# Patient Record
Sex: Male | Born: 1960
Health system: Southern US, Community
[De-identification: ages and names within clinical notes are randomized; demographics above are authoritative.]

## PROBLEM LIST (undated history)

## (undated) DIAGNOSIS — G43909 Migraine, unspecified, not intractable, without status migrainosus: Secondary | ICD-10-CM

## (undated) DIAGNOSIS — Z87442 Personal history of urinary calculi: Secondary | ICD-10-CM

## (undated) DIAGNOSIS — K219 Gastro-esophageal reflux disease without esophagitis: Secondary | ICD-10-CM

## (undated) HISTORY — PX: HERNIA REPAIR: SHX51

## (undated) HISTORY — PX: KNEE SURGERY: SHX244

## (undated) HISTORY — PX: FRACTURE SURGERY: SHX138

## (undated) HISTORY — DX: Migraine, unspecified, not intractable, without status migrainosus: G43.909

## (undated) HISTORY — PX: LEG SURGERY: SHX1003

---

## 1997-09-20 ENCOUNTER — Ambulatory Visit (HOSPITAL_COMMUNITY): Admission: RE | Admit: 1997-09-20 | Discharge: 1997-09-20 | Payer: Self-pay | Admitting: Gastroenterology

## 1999-03-08 ENCOUNTER — Encounter: Payer: Self-pay | Admitting: Emergency Medicine

## 1999-03-08 ENCOUNTER — Inpatient Hospital Stay (HOSPITAL_COMMUNITY): Admission: EM | Admit: 1999-03-08 | Discharge: 1999-03-12 | Payer: Self-pay | Admitting: Emergency Medicine

## 1999-03-09 ENCOUNTER — Encounter: Payer: Self-pay | Admitting: Orthopedic Surgery

## 1999-03-12 ENCOUNTER — Encounter: Payer: Self-pay | Admitting: Orthopedic Surgery

## 1999-05-28 ENCOUNTER — Encounter: Admission: RE | Admit: 1999-05-28 | Discharge: 1999-07-02 | Payer: Self-pay | Admitting: Orthopedic Surgery

## 2001-03-15 ENCOUNTER — Encounter: Admission: RE | Admit: 2001-03-15 | Discharge: 2001-04-11 | Payer: Self-pay | Admitting: Orthopedic Surgery

## 2010-03-18 LAB — SURGICAL PCR SCREEN
MRSA, PCR: NEGATIVE
Staphylococcus aureus: NEGATIVE

## 2010-03-19 ENCOUNTER — Ambulatory Visit (HOSPITAL_COMMUNITY)
Admission: RE | Admit: 2010-03-19 | Discharge: 2010-03-19 | Payer: Self-pay | Source: Home / Self Care | Attending: Surgery | Admitting: Surgery

## 2010-03-20 NOTE — Op Note (Signed)
Keith Shields                ACCOUNT NO.:  [id number]  MEDICAL RECORD NO.:  [id number]          PATIENT TYPE:  AMB  LOCATION:  DAY                          FACILITY:  [facility]  PHYSICIAN:  Abigail Miyamoto, M.D. DATE OF BIRTH:  [birth year]  DATE OF PROCEDURE:  03/19/2010 DATE OF DISCHARGE:                              OPERATIVE REPORT   PREOPERATIVE DIAGNOSIS:  Left inguinal hernia.  POSTOPERATIVE DIAGNOSIS:  Left inguinal hernia.  PROCEDURE:  Laparoscopic left inguinal hernia repair with mesh.  SURGEON:  Abigail Miyamoto, M.D.  ANESTHESIA:  General and 0.5% Marcaine.  ESTIMATED BLOOD LOSS:  Minimal.  FINDINGS:  The patient was found to have indirect left inguinal hernia. It was repaired with a 6 inch x 6 inch piece of Ultrapro mesh.  PROCEDURE IN DETAIL:  The patient was brought to the operative room, identified as Keith Shields.  He was placed supine on the operating table and general anesthesia was induced.  A Foley catheter was inserted.  His abdomen was then prepped and draped in usual sterile fashion.  Using #15 blade, a small vertical incision was made at the lower edge of the umbilicus.  This was carried down to the fascia which was then opened with the scalpel.  The rectus muscle was identified and elevated.  A dissecting balloon was then passed underneath the rectus muscle and manipulated toward the pubis.  The dissecting balloon was then insufflated under direct vision dissecting out the preperitoneal space. The dissecting balloon was then completely removed and insufflation of the preperitoneal space was performed with CO2.  Next, two 5-mm ports were placed in the midline under direct vision.  I then began my dissection of the left inguinal area.  The patient had a moderate-sized indirect inguinal hernia as well as lipoma of the cord.  I was able to easily reduce the lipoma as well as the hernia sac from the cord structures.  I was able to easily identify  Cooper ligament as well. There appeared to be no evidence of direct hernia.  I also examined his right inguinal area where he has had a previous repair and his repair appeared intact.  At this point, a piece of 6 x 6 Ultrapro mesh was brought to the field.  It was cut to appropriate size and then placed through the port at the umbilicus.  It was then opened up in onlay fashion in the left inguinal floor.  Using absorbable tacker, I tacked it to Cooper ligament up the medial abdominal wall and out laterally. Good coverage of the inguinal floor and testicular cord structures appeared to be achieved.  At this point, I kept the sac of the hernia out of the way and allowed the preperitoneal space to collapse appropriately.  Hemostasis appeared to be achieved.  At this point, all ports were removed under direct vision, and the preperitoneal space was completely deflated.  The fascia of the umbilicus was then closed with a 0 Vicryl figure-of-eight suture.  I also closed the small umbilical hernia defect with the figure-of-eight 0 Vicryl suture as well.  All incisions were then anesthetized with Marcaine.  I performed an ilioinguinal nerve block with Marcaine as well.  I then closed all skin incisions with 4-0 Monocryl subcuticular sutures.  Steri-Strips and Band-Aids were then applied.  The patient tolerated the procedure well.  All counts were correct at the end of the procedure.  The patient was then extubated in the operating room and taken in a stable condition to the recovery room.     Abigail Miyamoto, M.D.     DB/MEDQ  D:  03/19/2010  T:  03/19/2010  Job:  [id number]  Electronically Signed by Abigail Miyamoto M.D. on 03/20/2010 11:03:51 AM

## 2012-09-07 ENCOUNTER — Telehealth: Payer: Self-pay | Admitting: Nurse Practitioner

## 2012-09-07 ENCOUNTER — Ambulatory Visit (INDEPENDENT_AMBULATORY_CARE_PROVIDER_SITE_OTHER): Payer: BC Managed Care – PPO | Admitting: Nurse Practitioner

## 2012-09-07 ENCOUNTER — Encounter: Payer: Self-pay | Admitting: Nurse Practitioner

## 2012-09-07 VITALS — BP 125/82 | HR 80 | Temp 98.6°F | Ht 67.25 in | Wt 175.0 lb

## 2012-09-07 DIAGNOSIS — M6740 Ganglion, unspecified site: Secondary | ICD-10-CM

## 2012-09-07 DIAGNOSIS — M67432 Ganglion, left wrist: Secondary | ICD-10-CM

## 2012-09-07 NOTE — Progress Notes (Signed)
  Subjective:    Patient ID: Keith Shields, male    DOB: 07/03/1960, 52 y.o.   MRN: [id number]  HPI Patient in c/o a knot that has come up on the back of his hand- noticed it about 3 weeks ago- has gotten bigger- sore to touch.    Review of Systems  All other systems reviewed and are negative.       Objective:   Physical Exam  Constitutional: He appears well-developed and well-nourished.  Cardiovascular: Normal rate and normal heart sounds.   Pulmonary/Chest: Effort normal and breath sounds normal.  Musculoskeletal:  Soft 3cm nodule dorsal surface of left hand     BP 125/82  Pulse 80  Temp(Src) 98.6 F (37 C) (Oral)  Ht 5' 7.25" (1.708 m)  Wt 175 lb (79.379 kg)  BMI 27.21 kg/m2      Assessment & Plan:  1. Ganglion cyst of wrist, left Referral to ortho  Mary-Margaret Daphine Deutscher, FNP

## 2012-09-07 NOTE — Patient Instructions (Signed)
Ganglion Cyst °A ganglion cyst is a noncancerous, fluid-filled lump that occurs near joints or tendons. The ganglion cyst grows out of a joint or the lining of a tendon. It most often develops in the hand or wrist but can also develop in the shoulder, elbow, hip, knee, ankle, or foot. The round or oval ganglion can be pea sized or larger than a grape. Increased activity may enlarge the size of the cyst because more fluid starts to build up.  °CAUSES  °It is not completely known what causes a ganglion cyst to grow. However, it may be related to: °· Inflammation or irritation around the joint. °· An injury. °· Repetitive movements or overuse. °· Arthritis. °SYMPTOMS  °A lump most often appears in the hand or wrist, but can occur in other areas of the body. Generally, the lump is painless without other symptoms. However, sometimes pain can be felt during activity or when pressure is applied to the lump. The lump may even be tender to the touch. Tingling, pain, numbness, or muscle weakness can occur if the ganglion cyst presses on a nerve. Your grip may be weak and you may have less movement in your joints.  °DIAGNOSIS  °Ganglion cysts are most often diagnosed based on a physical exam, noting where the cyst is and how it looks. Your caregiver will feel the lump and may shine a light alongside it. If it is a ganglion, a light often shines through it. Your caregiver may order an X-ray, ultrasound, or MRI to rule out other conditions. °TREATMENT  °Ganglions usually go away on their own without treatment. If pain or other symptoms are involved, treatment may be needed. Treatment is also needed if the ganglion limits your movement or if it gets infected. Treatment options include: °· Wearing a wrist or finger brace or splint. °· Taking anti-inflammatory medicine. °· Draining fluid from the lump with a needle (aspiration). °· Injecting a steroid into the joint. °· Surgery to remove the ganglion cyst and its stalk that is  attached to the joint or tendon. However, ganglion cysts can grow back. °HOME CARE INSTRUCTIONS  °· Do not press on the ganglion, poke it with a needle, or hit it with a heavy object. You may rub the lump gently and often. Sometimes fluid moves out of the cyst. °· Only take medicines as directed by your caregiver. °· Wear your brace or splint as directed by your caregiver. °SEEK MEDICAL CARE IF:  °· Your ganglion becomes larger or more painful. °· You have increased redness, red streaks, or swelling. °· You have pus coming from the lump. °· You have weakness or numbness in the affected area. °MAKE SURE YOU:  °· Understand these instructions. °· Will watch your condition. °· Will get help right away if you are not doing well or get worse. °Document Released: 02/13/2000 Document Revised: 11/10/2011 Document Reviewed: 04/11/2007 °ExitCare® Patient Information ©2014 [employer]. ° °

## 2012-09-07 NOTE — Telephone Encounter (Signed)
Pain/tenderness in L hand x 2 weeks.  Now has developed a nodule that seems to be enlarging.  He would like to be evaluated.    Appt scheduled for this afternoon.  Patient aware.

## 2013-10-23 ENCOUNTER — Encounter: Payer: Self-pay | Admitting: Family Medicine

## 2013-10-23 ENCOUNTER — Ambulatory Visit (INDEPENDENT_AMBULATORY_CARE_PROVIDER_SITE_OTHER): Payer: 59 | Admitting: Family Medicine

## 2013-10-23 VITALS — BP 141/94 | HR 92 | Temp 98.7°F | Ht 67.25 in | Wt 177.0 lb

## 2013-10-23 DIAGNOSIS — B356 Tinea cruris: Secondary | ICD-10-CM

## 2013-10-23 MED ORDER — TERBINAFINE HCL 250 MG PO TABS: ORAL_TABLET | ORAL | Status: DC

## 2013-10-23 NOTE — Patient Instructions (Signed)
Jock Itch Jock itch is a fungal infection of the skin in the groin area. It is sometimes called "ringworm" even though it is not caused by a worm. A fungus is a type of germ that thrives in dark, damp places.  CAUSES  This infection may spread from:  A fungus infection elsewhere on the body (such as athlete's foot).  Sharing towels or clothing. This infection is more common in:  Hot, humid climates.  People who wear tight-fitting clothing or wet bathing suits for long periods of time.  Athletes.  Overweight people.  People with diabetes. SYMPTOMS  Jock itch causes the following symptoms:  Red, pink or brown rash in the groin. Rash may spread to the thighs, anus, and buttocks.  Itching. DIAGNOSIS  Your caregiver may make the diagnosis by looking at the rash. Sometimes a skin scraping will be sent to test for fungus. Testing can be done either by looking under the microscope or by doing a culture (test to try to grow the fungus). A culture can take up to 2 weeks to come back. TREATMENT  Jock itch may be treated with:  Skin cream or ointment to kill fungus.  Medicine by mouth to kill fungus.  Skin cream or ointment to calm the itching.  Compresses or medicated powders to dry the infected skin. HOME CARE INSTRUCTIONS   Be sure to treat the rash completely. Follow your caregiver's instructions. It can take a couple of weeks to treat. If you do not treat the infection long enough, the rash can come back.  Wear loose-fitting clothing.  Men should wear cotton boxer shorts.  Women should wear cotton underwear.  Avoid hot baths.  Dry the groin area well after bathing. SEEK MEDICAL CARE IF:   Your rash is worse.  Your rash is spreading.  Your rash returns after treatment is finished.  Your rash is not gone in 4 weeks. Fungal infections are slow to respond to treatment. Some redness may remain for several weeks after the fungus is gone. SEEK IMMEDIATE MEDICAL CARE  IF:  The area becomes red, warm, tender, and swollen.  You have a fever. Document Released: 02/05/2002 Document Revised: 05/10/2011 Document Reviewed: 01/05/2008 ExitCare Patient Information 2015 [employer]. This information is not intended to replace advice given to you by your health care provider. Make sure you discuss any questions you have with your health care provider.  

## 2013-10-23 NOTE — Progress Notes (Signed)
   Subjective:    Patient ID: Keith Shields, male    DOB: [birth year], 53 y.o.   MRN: [id number]  HPI  53 year old gentleman here with perineal rash. He has been present about a month. He has tried numerous over-the-counter meds without any relief. He does admit to increase moisture in the area and sweating.    Review of Systems  Constitutional: Negative.   HENT: Negative.   Eyes: Negative.   Respiratory: Negative.  Negative for shortness of breath.   Cardiovascular: Negative.  Negative for chest pain and leg swelling.  Gastrointestinal: Negative.   Genitourinary: Negative.   Musculoskeletal: Negative.   Skin: Negative.   Neurological: Negative.   Psychiatric/Behavioral: Negative.   All other systems reviewed and are negative.      Objective:   Physical Exam  Skin:  Skin of scrotum and inner thigh affected: erythematous with satellite lesions    BP 141/94  Pulse 92  Temp(Src) 98.7 F (37.1 C) (Oral)  Ht 5' 7.25" (1.708 m)  Wt 177 lb (80.287 kg)  BMI 27.52 kg/m2      Assessment & Plan:

## 2013-10-23 NOTE — Addendum Note (Signed)
Addended by: Ilean China on: 10/23/2013 04:29 PM   Modules accepted: Orders

## 2013-10-23 NOTE — Addendum Note (Signed)
Addended by: Wardell Honour on: 10/23/2013 04:11 PM   Modules accepted: Orders

## 2014-04-26 ENCOUNTER — Encounter: Payer: Self-pay | Admitting: Physician Assistant

## 2014-04-26 ENCOUNTER — Ambulatory Visit (INDEPENDENT_AMBULATORY_CARE_PROVIDER_SITE_OTHER): Payer: 59 | Admitting: Physician Assistant

## 2014-04-26 VITALS — BP 145/89 | HR 105 | Temp 97.6°F | Ht 67.25 in | Wt 180.0 lb

## 2014-04-26 DIAGNOSIS — M19042 Primary osteoarthritis, left hand: Secondary | ICD-10-CM

## 2014-04-26 DIAGNOSIS — J309 Allergic rhinitis, unspecified: Secondary | ICD-10-CM

## 2014-04-26 DIAGNOSIS — M1812 Unilateral primary osteoarthritis of first carpometacarpal joint, left hand: Secondary | ICD-10-CM

## 2014-04-26 MED ORDER — CETIRIZINE HCL 10 MG PO TABS: 10.0000 mg | ORAL_TABLET | Freq: Every day | ORAL | Status: DC

## 2014-04-26 MED ORDER — NAPROXEN 250 MG PO TABS: 250.0000 mg | ORAL_TABLET | Freq: Two times a day (BID) | ORAL | Status: DC

## 2014-04-26 NOTE — Progress Notes (Signed)
Subjective:     Patient ID: Keith Shields, male   DOB: Sep 16, 1960, 54 y.o.   MRN: [id number]  HPI 53 y/o RH dominant male presents with left thumb pain x several months. 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 Associated popping with movement. Past dx of ganglion cyst left wrist.    Review of Systems  Constitutional: Negative.   HENT: Positive for postnasal drip, rhinorrhea, sneezing and sore throat. Negative for sinus pressure.   Respiratory: Negative.   Cardiovascular: Negative.   Musculoskeletal: Positive for arthralgias (left thumb). Negative for joint swelling.  Skin: Negative.        Objective:   Physical Exam  Constitutional: He appears well-developed and well-nourished. No distress.  HENT:  Right Ear: External ear normal.  Left Ear: External ear normal.  Mouth/Throat: No oropharyngeal exudate.  Trace of erythema posterior pharynx bilaterally   Cardiovascular: Regular rhythm.  Exam reveals no gallop and no friction rub.   No murmur heard. Borderline HTN based on todays pressures tachycardic  Pulmonary/Chest: Effort normal and breath sounds normal. No respiratory distress. He has no wheezes. He has no rales. He exhibits no tenderness.  Skin: He is not diaphoretic.       Assessment:     1. Allergic Rhinitis: Zyrtec 10 mg q day. Avoid triggers 2. DJD left thumb: Naproxen 250mg  BID with food until follow up in 4 weeks 3. Borderline HTN: Patients states that his BP is elevated d/t being in MD office. Advised him to check BP frequently with a cuff or in a drug store to determine if BP is less at those times. If not, f/u in clinic for reassessment and monitoring.     Plan:

## 2014-05-07 ENCOUNTER — Encounter: Payer: Self-pay | Admitting: *Deleted

## 2014-05-21 ENCOUNTER — Ambulatory Visit (INDEPENDENT_AMBULATORY_CARE_PROVIDER_SITE_OTHER): Payer: 59

## 2014-05-21 ENCOUNTER — Encounter: Payer: Self-pay | Admitting: Family

## 2014-05-21 ENCOUNTER — Ambulatory Visit (INDEPENDENT_AMBULATORY_CARE_PROVIDER_SITE_OTHER): Payer: 59 | Admitting: Family

## 2014-05-21 VITALS — BP 162/100 | HR 95 | Temp 97.2°F | Ht 67.25 in | Wt 180.0 lb

## 2014-05-21 DIAGNOSIS — M79645 Pain in left finger(s): Secondary | ICD-10-CM

## 2014-05-21 MED ORDER — MELOXICAM 15 MG PO TABS: 15.0000 mg | ORAL_TABLET | Freq: Every day | ORAL | Status: DC

## 2014-05-21 MED ORDER — KETOROLAC TROMETHAMINE 60 MG/2ML IM SOLN
60.0000 mg | Freq: Once | INTRAMUSCULAR | Status: AC
  Administered 2014-05-21: 60 mg via INTRAMUSCULAR

## 2014-05-21 NOTE — Patient Instructions (Signed)
Arthritis, Nonspecific °Arthritis is inflammation of a joint. This usually means pain, redness, warmth or swelling are present. One or more joints may be involved. There are a number of types of arthritis. Your caregiver may not be able to tell what type of arthritis you have right away. °CAUSES  °The most common cause of arthritis is the wear and tear on the joint (osteoarthritis). This causes damage to the cartilage, which can break down over time. The knees, hips, back and neck are most often affected by this type of arthritis. °Other types of arthritis and common causes of joint pain include: °· Sprains and other injuries near the joint. Sometimes minor sprains and injuries cause pain and swelling that develop hours later. °· Rheumatoid arthritis. This affects hands, feet and knees. It usually affects both sides of your body at the same time. It is often associated with chronic ailments, fever, weight loss and general weakness. °· Crystal arthritis. Gout and pseudo gout can cause occasional acute severe pain, redness and swelling in the foot, ankle, or knee. °· Infectious arthritis. Bacteria can get into a joint through a break in overlying skin. This can cause infection of the joint. Bacteria and viruses can also spread through the blood and affect your joints. °· Drug, infectious and allergy reactions. Sometimes joints can become mildly painful and slightly swollen with these types of illnesses. °SYMPTOMS  °· Pain is the main symptom. °· Your joint or joints can also be red, swollen and warm or hot to the touch. °· You may have a fever with certain types of arthritis, or even feel overall ill. °· The joint with arthritis will hurt with movement. Stiffness is present with some types of arthritis. °DIAGNOSIS  °Your caregiver will suspect arthritis based on your description of your symptoms and on your exam. Testing may be needed to find the type of arthritis: °· Blood and sometimes urine tests. °· X-ray tests  and sometimes CT or MRI scans. °· Removal of fluid from the joint (arthrocentesis) is done to check for bacteria, crystals or other causes. Your caregiver (or a specialist) will numb the area over the joint with a local anesthetic, and use a needle to remove joint fluid for examination. This procedure is only minimally uncomfortable. °· Even with these tests, your caregiver may not be able to tell what kind of arthritis you have. Consultation with a specialist (rheumatologist) may be helpful. °TREATMENT  °Your caregiver will discuss with you treatment specific to your type of arthritis. If the specific type cannot be determined, then the following general recommendations may apply. °Treatment of severe joint pain includes: °· Rest. °· Elevation. °· Anti-inflammatory medication (for example, ibuprofen) may be prescribed. Avoiding activities that cause increased pain. °· Only take over-the-counter or prescription medicines for pain and discomfort as recommended by your caregiver. °· Cold packs over an inflamed joint may be used for 10 to 15 minutes every hour. Hot packs sometimes feel better, but do not use overnight. Do not use hot packs if you are diabetic without your caregiver's permission. °· A cortisone shot into arthritic joints may help reduce pain and swelling. °· Any acute arthritis that gets worse over the next 1 to 2 days needs to be looked at to be sure there is no joint infection. °Long-term arthritis treatment involves modifying activities and lifestyle to reduce joint stress jarring. This can include weight loss. Also, exercise is needed to nourish the joint cartilage and remove waste. This helps keep the muscles   around the joint strong. °HOME CARE INSTRUCTIONS  °· Do not take aspirin to relieve pain if gout is suspected. This elevates uric acid levels. °· Only take over-the-counter or prescription medicines for pain, discomfort or fever as directed by your caregiver. °· Rest the joint as much as  possible. °· If your joint is swollen, keep it elevated. °· Use crutches if the painful joint is in your leg. °· Drinking plenty of fluids may help for certain types of arthritis. °· Follow your caregiver's dietary instructions. °· Try low-impact exercise such as: °¨ Swimming. °¨ Water aerobics. °¨ Biking. °¨ Walking. °· Morning stiffness is often relieved by a warm shower. °· Put your joints through regular range-of-motion. °SEEK MEDICAL CARE IF:  °· You do not feel better in 24 hours or are getting worse. °· You have side effects to medications, or are not getting better with treatment. °SEEK IMMEDIATE MEDICAL CARE IF:  °· You have a fever. °· You develop severe joint pain, swelling or redness. °· Many joints are involved and become painful and swollen. °· There is severe back pain and/or leg weakness. °· You have loss of bowel or bladder control. °Document Released: 03/25/2004 Document Revised: 05/10/2011 Document Reviewed: 04/10/2008 °ExitCare® Patient Information ©2015 [employer]. This information is not intended to replace advice given to you by your health care provider. Make sure you discuss any questions you have with your health care provider. ° °

## 2014-05-21 NOTE — Progress Notes (Signed)
   Subjective:    Patient ID: Keith Shields, male    DOB: Jan 03, 1961, 54 y.o.   MRN: [id number]  HPI Pt presents to the office to recheck thumb pain. Pt states it started couple months ago. Pt denies any trauma. Pt states he has taken naproxen with no relief. Pt states it is constant aching pain of a 6 out 10. PT denies any history of gout.     Review of Systems  Constitutional: Negative.   HENT: Negative.   Respiratory: Negative.   Cardiovascular: Negative.   Gastrointestinal: Negative.   Endocrine: Negative.   Genitourinary: Negative.   Musculoskeletal: Negative.   Neurological: Negative.   Hematological: Negative.   Psychiatric/Behavioral: Negative.   All other systems reviewed and are negative.      Objective:   Physical Exam  Constitutional: He is oriented to person, place, and time. He appears well-developed and well-nourished. No distress.  HENT:  Head: Normocephalic.  Right Ear: External ear normal.  Left Ear: External ear normal.  Mouth/Throat: Oropharynx is clear and moist.  Eyes: Pupils are equal, round, and reactive to light. Right eye exhibits no discharge. Left eye exhibits no discharge.  Neck: Normal range of motion. Neck supple. No thyromegaly present.  Cardiovascular: Normal rate, regular rhythm, normal heart sounds and intact distal pulses.   No murmur heard. Pulmonary/Chest: Effort normal and breath sounds normal. No respiratory distress. He has no wheezes.  Abdominal: Soft. Bowel sounds are normal. He exhibits no distension. There is no tenderness.  Musculoskeletal: Normal range of motion. He exhibits no edema or tenderness.  Neurological: He is alert and oriented to person, place, and time. He has normal reflexes. No cranial nerve deficit.  Skin: Skin is warm and dry. No rash noted. No erythema.  Psychiatric: He has a normal mood and affect. His behavior is normal. Judgment and thought content normal.  Vitals reviewed.   Xray of left hand- negative  Preliminary reading by Evelina Dun, FNP [facility]       Assessment & Plan:  1. Thumb pain, left -Rest No other NSAID's -RTo prn - DG Finger Thumb Left; Future - Arthritis Panel - CMP14+EGFR - meloxicam (MOBIC) 15 MG tablet; Take 1 tablet (15 mg total) by mouth daily.  Dispense: 30 tablet; Refill: 0 - ketorolac (TORADOL) injection 60 mg; Inject 2 mLs (60 mg total) into the muscle once.  Evelina Dun, FNP

## 2014-05-22 LAB — CMP14+EGFR
ALT: 47 IU/L — ABNORMAL HIGH (ref 0–44)
AST: 31 IU/L (ref 0–40)
Albumin/Globulin Ratio: 1.6 (ref 1.1–2.5)
Albumin: 4.7 g/dL (ref 3.5–5.5)
Alkaline Phosphatase: 84 IU/L (ref 39–117)
BUN/Creatinine Ratio: 19 (ref 9–20)
BUN: 17 mg/dL (ref 6–24)
Bilirubin Total: 0.2 mg/dL (ref 0.0–1.2)
CO2: 26 mmol/L (ref 18–29)
Calcium: 9.8 mg/dL (ref 8.7–10.2)
Chloride: 100 mmol/L (ref 97–108)
Creatinine, Ser: 0.91 mg/dL (ref 0.76–1.27)
GFR calc Af Amer: 109 mL/min/{1.73_m2} (ref >59)
GFR calc non Af Amer: 95 mL/min/{1.73_m2} (ref >59)
Globulin, Total: 2.9 g/dL (ref 1.5–4.5)
Glucose: 102 mg/dL — ABNORMAL HIGH (ref 65–99)
Potassium: 4.3 mmol/L (ref 3.5–5.2)
Sodium: 141 mmol/L (ref 134–144)
Total Protein: 7.6 g/dL (ref 6.0–8.5)

## 2014-05-22 LAB — ARTHRITIS PANEL
Basophils Absolute: 0.1 10*3/uL (ref 0.0–0.2)
Basos: 1 %
Eos: 4 %
Eosinophils Absolute: 0.3 10*3/uL (ref 0.0–0.4)
HCT: 42.7 % (ref 37.5–51.0)
Hemoglobin: 14.6 g/dL (ref 12.6–17.7)
Immature Grans (Abs): 0 10*3/uL (ref 0.0–0.1)
Immature Granulocytes: 0 %
Lymphocytes Absolute: 3.7 10*3/uL — ABNORMAL HIGH (ref 0.7–3.1)
Lymphs: 46 %
MCH: 31.6 pg (ref 26.6–33.0)
MCHC: 34.2 g/dL (ref 31.5–35.7)
MCV: 92 fL (ref 79–97)
Monocytes Absolute: 0.6 10*3/uL (ref 0.1–0.9)
Monocytes: 8 %
Neutrophils Absolute: 3.3 10*3/uL (ref 1.4–7.0)
Neutrophils Relative %: 41 %
Platelets: 263 10*3/uL (ref 150–379)
RBC: 4.62 x10E6/uL (ref 4.14–5.80)
RDW: 13.2 % (ref 12.3–15.4)
Rhuematoid fact SerPl-aCnc: 10 IU/mL (ref 0.0–13.9)
Sed Rate: 9 mm/hr (ref 0–30)
Uric Acid: 7.1 mg/dL (ref 3.7–8.6)
WBC: 8 10*3/uL (ref 3.4–10.8)

## 2014-05-23 ENCOUNTER — Ambulatory Visit: Payer: 59 | Admitting: Family

## 2014-11-22 ENCOUNTER — Ambulatory Visit (INDEPENDENT_AMBULATORY_CARE_PROVIDER_SITE_OTHER): Payer: 59 | Admitting: Physician Assistant

## 2014-11-22 VITALS — BP 135/92 | HR 103 | Temp 98.3°F | Ht 67.25 in | Wt 168.0 lb

## 2014-11-22 DIAGNOSIS — M545 Low back pain, unspecified: Secondary | ICD-10-CM

## 2014-11-22 MED ORDER — DICLOFENAC SODIUM 75 MG PO TBEC: 75.0000 mg | DELAYED_RELEASE_TABLET | Freq: Two times a day (BID) | ORAL | Status: DC

## 2014-11-22 MED ORDER — PREDNISONE 10 MG (21) PO TBPK: ORAL_TABLET | ORAL | Status: DC

## 2014-11-22 MED ORDER — CYCLOBENZAPRINE HCL 10 MG PO TABS: 10.0000 mg | ORAL_TABLET | Freq: Three times a day (TID) | ORAL | Status: DC | PRN

## 2014-11-22 NOTE — Progress Notes (Signed)
   Subjective:    Patient ID: Keith Shields, male    DOB: [birth year], 54 y.o.   MRN: [id number]  HPI 54 y/o male presents with c/o right sided lower back pain. This started when he was swinging a golf club this morning. Immediated pain. Worse with twisting. Has decreased since this morning - he has been laying on a heating pad. Has not tried any medications.     Review of Systems  Constitutional: Negative.   HENT: Negative.   Gastrointestinal: Negative.   Genitourinary: Negative.   Musculoskeletal: Positive for back pain (right sided lower back pain , no radiation ).  Neurological:       Denies numbness, tingling , shooting pain down leg        Objective:   Physical Exam  Constitutional: He is oriented to person, place, and time. He appears well-developed and well-nourished. No distress.  Musculoskeletal: He exhibits tenderness (right low back ). He exhibits no edema.  Decreased rom d/t increased pain with movement   Neurological: He is alert and oriented to person, place, and time.  Skin: He is not diaphoretic.  Psychiatric: He has a normal mood and affect. His behavior is normal. Judgment and thought content normal.          Assessment & Plan:  1. Right-sided low back pain without sciatica - Instructions given for back pain, alternate heat and ice  - predniSONE (STERAPRED UNI-PAK 21 TAB) 10 MG (21) TBPK tablet; 6 pills PO on day 1, 5 on day 2, 4 on day 3, 3 on day 4, 2 on day 5, 1 on day 6  Dispense: 21 tablet; Refill: 0 - diclofenac (VOLTAREN) 75 MG EC tablet; Take 1 tablet (75 mg total) by mouth 2 (two) times daily.  Dispense: 30 tablet; Refill: 0 - cyclobenzaprine (FLEXERIL) 10 MG tablet; Take 1 tablet (10 mg total) by mouth 3 (three) times daily as needed for muscle spasms.  Dispense: 30 tablet; Refill: 0    RTO 2 weeks   Tiffany A. Benjamin Stain PA-C

## 2014-11-22 NOTE — Patient Instructions (Signed)
Back Pain, Adult Low back pain is very common. About 1 in 5 people have back pain.The cause of low back pain is rarely dangerous. The pain often gets better over time.About half of people with a sudden onset of back pain feel better in just 2 weeks. About 8 in 10 people feel better by 6 weeks.  CAUSES Some common causes of back pain include:  Strain of the muscles or ligaments supporting the spine.  Wear and tear (degeneration) of the spinal discs.  Arthritis.  Direct injury to the back. DIAGNOSIS Most of the time, the direct cause of low back pain is not known.However, back pain can be treated effectively even when the exact cause of the pain is unknown.Answering your caregiver's questions about your overall health and symptoms is one of the most accurate ways to make sure the cause of your pain is not dangerous. If your caregiver needs more information, he or she may order lab work or imaging tests (X-rays or MRIs).However, even if imaging tests show changes in your back, this usually does not require surgery. HOME CARE INSTRUCTIONS For many people, back pain returns.Since low back pain is rarely dangerous, it is often a condition that people can learn to manageon their own.   Remain active. It is stressful on the back to sit or stand in one place. Do not sit, drive, or stand in one place for more than 30 minutes at a time. Take short walks on level surfaces as soon as pain allows.Try to increase the length of time you walk each day.  Do not stay in bed.Resting more than 1 or 2 days can delay your recovery.  Do not avoid exercise or work.Your body is made to move.It is not dangerous to be active, even though your back may hurt.Your back will likely heal faster if you return to being active before your pain is gone.  Pay attention to your body when you bend and lift. Many people have less discomfortwhen lifting if they bend their knees, keep the load close to their bodies,and  avoid twisting. Often, the most comfortable positions are those that put less stress on your recovering back.  Find a comfortable position to sleep. Use a firm mattress and lie on your side with your knees slightly bent. If you lie on your back, put a pillow under your knees.  Only take over-the-counter or prescription medicines as directed by your caregiver. Over-the-counter medicines to reduce pain and inflammation are often the most helpful.Your caregiver may prescribe muscle relaxant drugs.These medicines help dull your pain so you can more quickly return to your normal activities and healthy exercise.  Put ice on the injured area.  Put ice in a plastic bag.  Place a towel between your skin and the bag.  Leave the ice on for 15-20 minutes, 03-04 times a day for the first 2 to 3 days. After that, ice and heat may be alternated to reduce pain and spasms.  Ask your caregiver about trying back exercises and gentle massage. This may be of some benefit.  Avoid feeling anxious or stressed.Stress increases muscle tension and can worsen back pain.It is important to recognize when you are anxious or stressed and learn ways to manage it.Exercise is a great option. SEEK MEDICAL CARE IF:  You have pain that is not relieved with rest or medicine.  You have pain that does not improve in 1 week.  You have new symptoms.  You are generally not feeling well. SEEK   IMMEDIATE MEDICAL CARE IF:  °· You have pain that radiates from your back into your legs. °· You develop new bowel or bladder control problems. °· You have unusual weakness or numbness in your arms or legs. °· You develop nausea or vomiting. °· You develop abdominal pain. °· You feel faint. °Document Released: 02/15/2005 Document Revised: 08/17/2011 Document Reviewed: 06/19/2013 °ExitCare® Patient Information ©2015 [employer]. This information is not intended to replace advice given to you by your health care provider. Make sure you  discuss any questions you have with your health care provider. ° °Lumbosacral Strain °Lumbosacral strain is a strain of any of the parts that make up your lumbosacral vertebrae. Your lumbosacral vertebrae are the bones that make up the lower third of your backbone. Your lumbosacral vertebrae are held together by muscles and tough, fibrous tissue (ligaments).  °CAUSES  °A sudden blow to your back can cause lumbosacral strain. Also, anything that causes an excessive stretch of the muscles in the low back can cause this strain. This is typically seen when people exert themselves strenuously, fall, lift heavy objects, bend, or crouch repeatedly. °RISK FACTORS °· Physically demanding work. °· Participation in pushing or pulling sports or sports that require a sudden twist of the back (tennis, golf, baseball). °· Weight lifting. °· Excessive lower back curvature. °· Forward-tilted pelvis. °· Weak back or abdominal muscles or both. °· Tight hamstrings. °SIGNS AND SYMPTOMS  °Lumbosacral strain may cause pain in the area of your injury or pain that moves (radiates) down your leg.  °DIAGNOSIS °Your health care provider can often diagnose lumbosacral strain through a physical exam. In some cases, you may need tests such as X-ray exams.  °TREATMENT  °Treatment for your lower back injury depends on many factors that your clinician will have to evaluate. However, most treatment will include the use of anti-inflammatory medicines. °HOME CARE INSTRUCTIONS  °· Avoid hard physical activities (tennis, racquetball, waterskiing) if you are not in proper physical condition for it. This may aggravate or create problems. °· If you have a back problem, avoid sports requiring sudden body movements. Swimming and walking are generally safer activities. °· Maintain good posture. °· Maintain a healthy weight. °· For acute conditions, you may put ice on the injured area. °¨ Put ice in a plastic bag. °¨ Place a towel between your skin and the  bag. °¨ Leave the ice on for 20 minutes, 2-3 times a day. °· When the low back starts healing, stretching and strengthening exercises may be recommended. °SEEK MEDICAL CARE IF: °· Your back pain is getting worse. °· You experience severe back pain not relieved with medicines. °SEEK IMMEDIATE MEDICAL CARE IF:  °· You have numbness, tingling, weakness, or problems with the use of your arms or legs. °· There is a change in bowel or bladder control. °· You have increasing pain in any area of the body, including your belly (abdomen). °· You notice shortness of breath, dizziness, or feel faint. °· You feel sick to your stomach (nauseous), are throwing up (vomiting), or become sweaty. °· You notice discoloration of your toes or legs, or your feet get very cold. °MAKE SURE YOU:  °· Understand these instructions. °· Will watch your condition. °· Will get help right away if you are not doing well or get worse. °Document Released: 11/25/2004 Document Revised: 02/20/2013 Document Reviewed: 10/04/2012 °ExitCare® Patient Information ©2015 [employer]. This information is not intended to replace advice given to you by your health care provider.   sure you discuss any questions you have with your health care provider.  

## 2015-01-28 ENCOUNTER — Telehealth: Payer: Self-pay | Admitting: Family Medicine

## 2015-01-28 NOTE — Telephone Encounter (Signed)
Denied.

## 2015-02-25 ENCOUNTER — Ambulatory Visit (INDEPENDENT_AMBULATORY_CARE_PROVIDER_SITE_OTHER): Payer: 59 | Admitting: Family Medicine

## 2015-02-25 ENCOUNTER — Encounter: Payer: Self-pay | Admitting: Family Medicine

## 2015-02-25 VITALS — BP 135/83 | HR 82 | Temp 97.4°F | Ht 67.25 in | Wt 171.4 lb

## 2015-02-25 DIAGNOSIS — K219 Gastro-esophageal reflux disease without esophagitis: Secondary | ICD-10-CM | POA: Diagnosis not present

## 2015-02-25 MED ORDER — PANTOPRAZOLE SODIUM 40 MG PO TBEC: 40.0000 mg | DELAYED_RELEASE_TABLET | Freq: Every day | ORAL | Status: DC

## 2015-02-25 NOTE — Progress Notes (Signed)
   Subjective:    Patient ID: Keith Shields, male    DOB: 10/26/1960, 54 y.o.   MRN: [id number]  HPI 54 year old gentleman with two-week history of indigestion and heartburn. He takes a lot of Tums. No change in diet. He denies use of alcohol or NSAIDs or aspirin. He has what sounds like water brash. Food tends to make it worse. There is no nighttime wakening. No history of bleeding.  There are no active problems to display for this patient.  Outpatient Encounter Prescriptions as of 02/25/2015  Medication Sig  . [DISCONTINUED] cyclobenzaprine (FLEXERIL) 10 MG tablet Take 1 tablet (10 mg total) by mouth 3 (three) times daily as needed for muscle spasms.  . [DISCONTINUED] diclofenac (VOLTAREN) 75 MG EC tablet Take 1 tablet (75 mg total) by mouth 2 (two) times daily.  . [DISCONTINUED] predniSONE (STERAPRED UNI-PAK 21 TAB) 10 MG (21) TBPK tablet 6 pills PO on day 1, 5 on day 2, 4 on day 3, 3 on day 4, 2 on day 5, 1 on day 6   No facility-administered encounter medications on file as of 02/25/2015.      Review of Systems  Constitutional: Negative.   Respiratory: Negative.   Cardiovascular: Negative.   Gastrointestinal: Positive for abdominal pain.  Musculoskeletal: Negative.   Neurological: Negative.   Psychiatric/Behavioral: Negative.        Objective:   Physical Exam  Constitutional: He appears well-developed and well-nourished.  Cardiovascular: Normal rate.   Pulmonary/Chest: Effort normal and breath sounds normal.  Abdominal: Soft. There is no tenderness. There is no rebound.          Assessment & Plan:  1. Gastroesophageal reflux disease, esophagitis presence not specified Avoid spicy foods or any foods that he notes aggravate symptoms. Will try Protonix 40 mg daily. Symptoms are relieved consider testing for H. pylori or referral to GI for endoscopy.  Wardell Honour MD

## 2015-04-28 ENCOUNTER — Ambulatory Visit: Payer: 59 | Admitting: Family

## 2015-12-19 ENCOUNTER — Encounter: Payer: Self-pay | Admitting: Nurse Practitioner

## 2015-12-19 ENCOUNTER — Ambulatory Visit (INDEPENDENT_AMBULATORY_CARE_PROVIDER_SITE_OTHER): Payer: Worker's Compensation

## 2015-12-19 ENCOUNTER — Ambulatory Visit (INDEPENDENT_AMBULATORY_CARE_PROVIDER_SITE_OTHER): Payer: Worker's Compensation | Admitting: Nurse Practitioner

## 2015-12-19 VITALS — BP 134/84 | HR 91 | Temp 97.3°F | Ht 67.0 in | Wt 172.0 lb

## 2015-12-19 DIAGNOSIS — M545 Low back pain, unspecified: Secondary | ICD-10-CM

## 2015-12-19 DIAGNOSIS — S39012A Strain of muscle, fascia and tendon of lower back, initial encounter: Secondary | ICD-10-CM | POA: Diagnosis not present

## 2015-12-19 MED ORDER — NAPROXEN 500 MG PO TABS: 500.0000 mg | ORAL_TABLET | Freq: Two times a day (BID) | ORAL | 1 refills | Status: DC

## 2015-12-19 MED ORDER — CYCLOBENZAPRINE HCL 10 MG PO TABS: 10.0000 mg | ORAL_TABLET | Freq: Three times a day (TID) | ORAL | 1 refills | Status: DC | PRN

## 2015-12-19 NOTE — Progress Notes (Signed)
   Subjective:    Patient ID: NEELY BESTER, male    DOB: 15-Jun-1960, 55 y.o.   MRN: [id number]  HPI  DATE OF INJURY 12/18/15  [employer]  Patient was pulling wire with a pair of pliers and stepped in wax and his feet slipped out from under him and he landed on his back. Now have low back pain along tail bone. Says that he will have a bowel movement and still feels like he has to go. Rate [place] currently 6/10. No radiation of pain. Pian decreases once he is up moving around. No increase in pain with sitting.  Review of Systems  Constitutional: Negative.   HENT: Negative.   Respiratory: Negative.   Cardiovascular: Negative.   Gastrointestinal: Negative.   Genitourinary: Negative.   Musculoskeletal: Positive for back pain.  Neurological: Negative.   Psychiatric/Behavioral: Negative.   All other systems reviewed and are negative.      Objective:   Physical Exam  Constitutional: He appears well-developed and well-nourished. No distress.  Cardiovascular: Normal rate, regular rhythm and normal heart sounds.   Pulmonary/Chest: Effort normal and breath sounds normal.  Musculoskeletal: Normal range of motion.  FROM of lumbar spine without pulling sensation on flexion (-) SLR bil  motor strength and sensation distally intact  Neurological: He has normal reflexes.  Skin: Skin is warm.  Psychiatric: He has a normal mood and affect. His behavior is normal. Judgment and thought content normal.   BP 134/84   Pulse 91   Temp 97.3 F (36.3 C) (Oral)   Ht 5\' 7"  (1.702 m)   Wt 172 lb (78 kg)   BMI 26.94 kg/m   Lumbar spine xray- normal-Mary-Margaret Hassell Done, FNP      Assessment & Plan:   1. Acute midline low back pain without sciatica   2. Low back strain, initial encounter    Meds ordered this encounter  Medications  . cyclobenzaprine (FLEXERIL) 10 MG tablet    Sig: Take 1 tablet (10 mg total) by mouth 3 (three) times daily as needed for muscle spasms.   Dispense:  30 tablet    Refill:  1    Order Specific Question:   Supervising Provider    Answer:   VINCENT, CAROL L [[id number]]  . naproxen (NAPROSYN) 500 MG tablet    Sig: Take 1 tablet (500 mg total) by mouth 2 (two) times daily with a meal.    Dispense:  60 tablet    Refill:  1    Order Specific Question:   Supervising Provider    Answer:   Evette Doffing, CAROL L [[id number]]  medication sedation precautions Moist heat Rest No heavy lifting for 3 days RTO prn  Mary-Margaret Hassell Done, FNP

## 2015-12-19 NOTE — Patient Instructions (Signed)

## 2016-03-11 ENCOUNTER — Encounter: Payer: Self-pay | Admitting: Family

## 2016-03-11 ENCOUNTER — Ambulatory Visit (INDEPENDENT_AMBULATORY_CARE_PROVIDER_SITE_OTHER): Payer: 59 | Admitting: Family

## 2016-03-11 VITALS — BP 157/100 | HR 100 | Temp 99.0°F | Ht 67.0 in | Wt 178.0 lb

## 2016-03-11 DIAGNOSIS — J029 Acute pharyngitis, unspecified: Secondary | ICD-10-CM

## 2016-03-11 LAB — RAPID STREP SCREEN (MED CTR MEBANE ONLY): Strep Gp A Ag, IA W/Reflex: NEGATIVE

## 2016-03-11 LAB — CULTURE, GROUP A STREP

## 2016-03-11 MED ORDER — AMOXICILLIN 875 MG PO TABS
875.0000 mg | ORAL_TABLET | Freq: Two times a day (BID) | ORAL | 0 refills | Status: DC
Start: 2016-03-11 — End: 2016-04-07

## 2016-03-11 NOTE — Patient Instructions (Signed)

## 2016-03-11 NOTE — Progress Notes (Signed)
   Subjective:    Patient ID: KYRUS SPRIGG, male    DOB: 02-26-1961, 56 y.o.   MRN: [id number]  Sore Throat   This is a new problem. The current episode started today. Maximum temperature: 99. The pain is at a severity of 7/10. The pain is moderate. Associated symptoms include congestion, ear pain, a hoarse voice, swollen glands and trouble swallowing. Pertinent negatives include no headaches. He has tried acetaminophen (mucinex) for the symptoms. The treatment provided mild relief.      Review of Systems  HENT: Positive for congestion, ear pain, hoarse voice and trouble swallowing.   Neurological: Negative for headaches.  All other systems reviewed and are negative.      Objective:   Physical Exam  Constitutional: He is oriented to person, place, and time. He appears well-developed and well-nourished. No distress.  HENT:  Head: Normocephalic.  Right Ear: External ear normal.  Left Ear: External ear normal.  Nose: Mucosal edema and rhinorrhea present.  Mouth/Throat: Uvula swelling present. Posterior oropharyngeal edema and posterior oropharyngeal erythema present.  Eyes: Pupils are equal, round, and reactive to light. Right eye exhibits no discharge. Left eye exhibits no discharge.  Neck: Normal range of motion. Neck supple. No thyromegaly present.  Cardiovascular: Normal rate, regular rhythm, normal heart sounds and intact distal pulses.   No murmur heard. Pulmonary/Chest: Effort normal and breath sounds normal. No respiratory distress. He has no wheezes.  Abdominal: Soft. Bowel sounds are normal. He exhibits no distension. There is no tenderness.  Musculoskeletal: Normal range of motion. He exhibits no edema or tenderness.  Neurological: He is alert and oriented to person, place, and time. He has normal reflexes. No cranial nerve deficit.  Skin: Skin is warm and dry. No rash noted. No erythema.  Psychiatric: He has a normal mood and affect. His behavior is normal. Judgment and  thought content normal.  Vitals reviewed.     BP (!) 157/100   Pulse 100   Temp 99 F (37.2 C) (Oral)   Ht 5\' 7"  (1.702 m)   Wt 178 lb (80.7 kg)   BMI 27.88 kg/m      Assessment & Plan:  1. Sore throat - Rapid strep screen (not at [facility])  2. Acute pharyngitis, unspecified etiology - Take meds as prescribed - Use a cool mist humidifier  -Use saline nose sprays frequently -Saline irrigations of the nose can be very helpful if done frequently.  * 4X daily for 1 week*  * Use of a nettie pot can be helpful with this. Follow directions with this* -Force fluids -For any cough or congestion  Use plain Mucinex- regular strength or max strength is fine   * Children- consult with Pharmacist for dosing -For fever or aces or pains- take tylenol or ibuprofen appropriate for age and weight.  * for fevers greater than 101 orally you may alternate ibuprofen and tylenol every  3 hours. -Throat lozenges if help -New toothbrush in 3 days - amoxicillin (AMOXIL) 875 MG tablet; Take 1 tablet (875 mg total) by mouth 2 (two) times daily.  Dispense: 14 tablet; Refill: 0  Evelina Dun, FNP

## 2016-04-07 ENCOUNTER — Encounter: Payer: Self-pay | Admitting: Family Medicine

## 2016-04-07 ENCOUNTER — Ambulatory Visit (INDEPENDENT_AMBULATORY_CARE_PROVIDER_SITE_OTHER): Payer: 59 | Admitting: Family Medicine

## 2016-04-07 VITALS — BP 146/91 | HR 80 | Temp 98.4°F | Ht 67.0 in | Wt 171.4 lb

## 2016-04-07 DIAGNOSIS — R109 Unspecified abdominal pain: Secondary | ICD-10-CM

## 2016-04-07 DIAGNOSIS — R3915 Urgency of urination: Secondary | ICD-10-CM

## 2016-04-07 LAB — URINALYSIS
Bilirubin, UA: NEGATIVE
Glucose, UA: NEGATIVE
Ketones, UA: NEGATIVE
Leukocytes, UA: NEGATIVE
Nitrite, UA: NEGATIVE
Protein, UA: NEGATIVE
RBC, UA: NEGATIVE
Specific Gravity, UA: 1.015 (ref 1.005–1.030)
Urobilinogen, Ur: 1 mg/dL (ref 0.2–1.0)
pH, UA: 6 (ref 5.0–7.5)

## 2016-04-07 MED ORDER — SULFAMETHOXAZOLE-TRIMETHOPRIM 800-160 MG PO TABS: 1.0000 | ORAL_TABLET | Freq: Two times a day (BID) | ORAL | 0 refills | Status: DC

## 2016-04-07 NOTE — Progress Notes (Signed)
   BP (!) 146/91 (BP Location: Left Arm, Patient Position: Sitting, Cuff Size: Normal)   Pulse 80   Temp 98.4 F (36.9 C) (Oral)   Ht 5\' 7"  (1.702 m)   Wt 171 lb 6.4 oz (77.7 kg)   BMI 26.85 kg/m    Subjective:    Patient ID: Keith Shields, male    DOB: 19-Oct-1960, 56 y.o.   MRN: [id number]  HPI: REAGEN LONGENBERGER is a 56 y.o. male presenting on 04/07/2016 for Urinary Tract Infection (urgency, L flank pain x 2 wks)   HPI Urinary urgency and left flank pain Patient has had urinary urgency and feelings of incomplete emptying of his bladder is been going on for the past 2 weeks. He has developed left flank pain over the past couple days. He denies any fevers or chills or abdominal pain. He says he has had some dysuria but not too much. He did try to either to help with that which did help with the symptoms then turned his urine orange so does not want to take that again. He has not had recurrent issues with this. He has had recurrent issues with this prostate some time ago but not more recently and has not had that checked in sometime.  Relevant past medical, surgical, family and social history reviewed and updated as indicated. Interim medical history since our last visit reviewed. Allergies and medications reviewed and updated.  Review of Systems  Constitutional: Negative for chills and fever.  Respiratory: Negative for shortness of breath and wheezing.   Cardiovascular: Negative for chest pain and leg swelling.  Gastrointestinal: Negative for abdominal pain, constipation, diarrhea, nausea and vomiting.  Genitourinary: Positive for dysuria, frequency and urgency. Negative for decreased urine volume and hematuria.  Musculoskeletal: Negative for back pain and gait problem.  Skin: Negative for rash.  All other systems reviewed and are negative.   Per HPI unless specifically indicated above      Objective:    BP (!) 146/91 (BP Location: Left Arm, Patient Position: Sitting, Cuff Size:  Normal)   Pulse 80   Temp 98.4 F (36.9 C) (Oral)   Ht 5\' 7"  (1.702 m)   Wt 171 lb 6.4 oz (77.7 kg)   BMI 26.85 kg/m   Wt Readings from Last 3 Encounters:  04/07/16 171 lb 6.4 oz (77.7 kg)  03/11/16 178 lb (80.7 kg)  12/19/15 172 lb (78 kg)    Physical Exam  Urinalysis, dip only: Completely normal dip.    Assessment & Plan:   Problem List Items Addressed This Visit    None    Visit Diagnoses    Urgency of urination    -  Primary   Relevant Medications   sulfamethoxazole-trimethoprim (BACTRIM DS,SEPTRA DS) 800-160 MG tablet   Other Relevant Orders   Urine culture   Urinalysis   Left flank pain       Relevant Medications   sulfamethoxazole-trimethoprim (BACTRIM DS,SEPTRA DS) 800-160 MG tablet   Other Relevant Orders   Urine culture   Urinalysis      Will treat symptomatically and sent for culture. May consider rechecking prostate with PCP in the future.  Follow up plan: Return if symptoms worsen or fail to improve.  Counseling provided for all of the vaccine components Orders Placed This Encounter  Procedures  . Urine culture  . Urinalysis    Caryl Pina, MD [facility] 04/07/2016, 6:18 PM

## 2016-04-10 LAB — URINE CULTURE

## 2016-10-11 ENCOUNTER — Encounter: Payer: Self-pay | Admitting: Family Medicine

## 2016-10-11 ENCOUNTER — Ambulatory Visit (INDEPENDENT_AMBULATORY_CARE_PROVIDER_SITE_OTHER): Payer: Managed Care, Other (non HMO) | Admitting: Family Medicine

## 2016-10-11 VITALS — BP 138/82 | HR 87 | Temp 98.2°F | Ht 67.0 in | Wt 166.6 lb

## 2016-10-11 DIAGNOSIS — G5603 Carpal tunnel syndrome, bilateral upper limbs: Secondary | ICD-10-CM

## 2016-10-11 NOTE — Progress Notes (Signed)
BP 138/82   Pulse 87   Temp 98.2 F (36.8 C) (Oral)   Ht 5\' 7"  (1.702 m)   Wt 166 lb 9.6 oz (75.6 kg)   BMI 26.09 kg/m    Subjective:    Patient ID: Keith Shields, male    DOB: 05/11/1960, 56 y.o.   MRN: [id number]  HPI: Keith Shields is a 56 y.o. male presenting on 10/11/2016 for Numbness (Patient states at night his hands have "been going to sleep and feel numb at night". States it has been going on a few months.)   HPI Numbness in his hand at night and some tingling during the day Patient is coming in complaining of his hands going numb. He says at night they will go numb and his been filling it on his middle 3 fingers on both hands. This is been increasing over the past few months where it is now happening almost every night. He says his never had this before. He says he had some neck problems before but does not notice will BE correlated to this but he denies any neck pain currently. He denies any weakness or loss of grip strength. He says he does not experience this issue during the day  Relevant past medical, surgical, family and social history reviewed and updated as indicated. Interim medical history since our last visit reviewed. Allergies and medications reviewed and updated.  Review of Systems  Constitutional: Negative for chills and fever.  Eyes: Negative for discharge.  Respiratory: Negative for shortness of breath and wheezing.   Cardiovascular: Negative for chest pain and leg swelling.  Musculoskeletal: Negative for back pain, gait problem, myalgias, neck pain and neck stiffness.  Skin: Negative for rash.  Neurological: Positive for numbness. Negative for weakness.  All other systems reviewed and are negative.   Per HPI unless specifically indicated above        Objective:    BP 138/82   Pulse 87   Temp 98.2 F (36.8 C) (Oral)   Ht 5\' 7"  (1.702 m)   Wt 166 lb 9.6 oz (75.6 kg)   BMI 26.09 kg/m   Wt Readings from Last 3 Encounters:  10/11/16 166 lb  9.6 oz (75.6 kg)  04/07/16 171 lb 6.4 oz (77.7 kg)  03/11/16 178 lb (80.7 kg)    Physical Exam  Constitutional: He is oriented to person, place, and time. He appears well-developed and well-nourished. No distress.  Eyes: Conjunctivae are normal. No scleral icterus.  Cardiovascular: Normal rate, regular rhythm, normal heart sounds and intact distal pulses.   No murmur heard. Pulmonary/Chest: Effort normal and breath sounds normal. No respiratory distress. He has no wheezes. He has no rales.  Musculoskeletal: Normal range of motion. He exhibits no edema.  Neurological: He is alert and oriented to person, place, and time. Coordination normal.  Unable to elicit any numbness today, Tinel's and Phalen's sign negative today in the office.  Skin: Skin is warm and dry. No rash noted. He is not diaphoretic.  Psychiatric: He has a normal mood and affect. His behavior is normal.  Nursing note and vitals reviewed.       Assessment & Plan:   Problem List Items Addressed This Visit    None    Visit Diagnoses    Bilateral carpal tunnel syndrome    -  Primary   Relevant Orders   DME Other see comment      Based on history it sounds like he is having intermittent  carpal tunnel syndrome, recommended for him to try the splints at night and use anti-inflammatories Follow up plan: Return if symptoms worsen or fail to improve.  Counseling provided for all of the vaccine components Orders Placed This Encounter  Procedures  . DME Other see comment    Caryl Pina, MD [facility] 10/11/2016, 6:03 PM

## 2016-10-18 ENCOUNTER — Telehealth: Payer: Self-pay | Admitting: Family Medicine

## 2016-10-18 DIAGNOSIS — G5603 Carpal tunnel syndrome, bilateral upper limbs: Secondary | ICD-10-CM

## 2016-10-18 NOTE — Telephone Encounter (Signed)
What type of referral do you need? Orthopedic  Have you been seen at our office for this problem? Yes for carpal tunnel (If no, schedule them an appointment.  They will need to be seen before a referral can be done.)  Is there a particular doctor or location that you prefer? [facility]  Patient notified that referrals can take up to a week or longer to process. If they haven't heard anything within a week they should call back and speak with the referral department.

## 2016-10-19 NOTE — Telephone Encounter (Signed)
Can we refer to ortho?

## 2016-10-19 NOTE — Telephone Encounter (Signed)
Yes to go ahead and do a referral to orthopedic

## 2016-10-19 NOTE — Telephone Encounter (Signed)
Patient notified via voicemail that referral to ortho was ordered, ortho referral request sent to referrals

## 2017-03-18 ENCOUNTER — Ambulatory Visit: Payer: Managed Care, Other (non HMO) | Admitting: Family Medicine

## 2017-03-18 ENCOUNTER — Encounter: Payer: Self-pay | Admitting: Family Medicine

## 2017-03-18 VITALS — BP 128/79 | HR 91 | Temp 97.9°F | Ht 67.0 in | Wt 172.0 lb

## 2017-03-18 DIAGNOSIS — H66003 Acute suppurative otitis media without spontaneous rupture of ear drum, bilateral: Secondary | ICD-10-CM | POA: Diagnosis not present

## 2017-03-18 DIAGNOSIS — J0100 Acute maxillary sinusitis, unspecified: Secondary | ICD-10-CM

## 2017-03-18 MED ORDER — AMOXICILLIN-POT CLAVULANATE 875-125 MG PO TABS: 1.0000 | ORAL_TABLET | Freq: Two times a day (BID) | ORAL | 0 refills | Status: DC

## 2017-03-18 NOTE — Progress Notes (Signed)
Chief Complaint  Patient presents with  . Cough    pt here today c/o cough, congestion, sinus drainage and ears feel full    HPI  Patient presents today for Patient presents with upper respiratory congestion. Rhinorrhea that is frequently purulent. There is moderate sore throat. Patient reports coughing frequently as well.  clear sputum noted. There is no fever, chills or sweats. The patient denies being short of breath. Onset was 3-5 days ago. Gradually worsening. Tried OTCs without improvement.  PMH: Smoking status noted ROS: Per HPI  Objective: BP 128/79   Pulse 91   Temp 97.9 F (36.6 C) (Oral)   Ht 5\' 7"  (1.702 m)   Wt 172 lb (78 kg)   BMI 26.94 kg/m  Gen: NAD, alert, cooperative with exam HEENT: NCAT, Nasal passages swollen, red TMS RED bilat. Max sinuses tender CV: RRR, good S1/S2, no murmur Resp: Bronchitis changes with scattered wheezes, non-labored Ext: No edema, warm Neuro: Alert and oriented, No gross deficits  Assessment and plan:  1. Acute maxillary sinusitis, recurrence not specified   2. Acute suppurative otitis media of both ears without spontaneous rupture of tympanic membranes, recurrence not specified     Meds ordered this encounter  Medications  . amoxicillin-clavulanate (AUGMENTIN) 875-125 MG tablet    Sig: Take 1 tablet by mouth 2 (two) times daily. Take all of this medication    Dispense:  20 tablet    Refill:  0    No orders of the defined types were placed in this encounter.   Follow up as needed.  Claretta Fraise, MD

## 2017-11-17 ENCOUNTER — Encounter: Payer: Self-pay | Admitting: Family Medicine

## 2017-11-17 ENCOUNTER — Ambulatory Visit: Payer: Managed Care, Other (non HMO) | Admitting: Family Medicine

## 2017-11-17 VITALS — BP 141/91 | HR 74 | Temp 97.5°F | Ht 67.0 in | Wt 172.4 lb

## 2017-11-17 DIAGNOSIS — J019 Acute sinusitis, unspecified: Secondary | ICD-10-CM

## 2017-11-17 MED ORDER — FLUTICASONE PROPIONATE 50 MCG/ACT NA SUSP: 1.0000 | Freq: Two times a day (BID) | NASAL | 6 refills | Status: DC | PRN

## 2017-11-17 NOTE — Progress Notes (Signed)
BP (!) 141/91   Pulse 74   Temp (!) 97.5 F (36.4 C) (Oral)   Ht 5\' 7"  (1.702 m)   Wt 78.2 kg   SpO2 97%   BMI 27.00 kg/m    Subjective:    Patient ID: Keith Shields, male    DOB: 1960-08-16, 57 y.o.   MRN: [id number]  HPI: Keith Shields is a 57 y.o. male presenting on 11/17/2017 for chest congestion (Patient states it started lastnight.); Sore Throat; Cough; and Nasal Congestion  Patient starting have a sore throat and nasal congestion starting last night. He has an occasional nonproductive cough and left ear pain. He admits to ear pressure for over a month. He has not taken anything for his symptoms. 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 He has not had any fevers or chills.  Relevant past medical, surgical, family and social history reviewed and updated as indicated. Interim medical history since our last visit reviewed. Allergies and medications reviewed and updated.  Review of Systems  Constitutional: Negative for chills and fever.  HENT: Positive for congestion, ear pain, rhinorrhea and sore throat. Negative for sinus pressure and sneezing. Facial swelling: left.   Eyes: Negative for pain and itching.  Respiratory: Positive for cough (intermittent). Negative for chest tightness.   Cardiovascular: Negative for chest pain.  Gastrointestinal: Negative for abdominal pain, diarrhea, nausea and vomiting.  Musculoskeletal: Negative for myalgias.  Neurological: Negative for headaches.    Per HPI unless specifically indicated above   Allergies as of 11/17/2017      Reactions   Codeine Nausea Only   Keflex [cephalexin] Rash      Medication List        Accurate as of 11/17/17 10:39 AM. Always use your most recent med list.          amoxicillin-clavulanate 875-125 MG tablet Commonly known as:  AUGMENTIN Take 1 tablet by mouth 2 (two) times daily. Take all of this medication   fluticasone 50 MCG/ACT nasal spray Commonly known as:   FLONASE Place 1 spray into both nostrils 2 (two) times daily as needed for allergies or rhinitis.          Objective:    BP (!) 141/91   Pulse 74   Temp (!) 97.5 F (36.4 C) (Oral)   Ht 5\' 7"  (1.702 m)   Wt 78.2 kg   SpO2 97%   BMI 27.00 kg/m   Wt Readings from Last 3 Encounters:  11/17/17 78.2 kg  03/18/17 78 kg  10/11/16 75.6 kg    Physical Exam  Constitutional: He is oriented to person, place, and time. He appears well-developed and well-nourished.  HENT:  Right Ear: Tympanic membrane normal. No drainage, swelling or tenderness.  Left Ear: Tympanic membrane normal. No drainage, swelling or tenderness.  Mouth/Throat: Uvula is midline. Posterior oropharyngeal edema (cobblestoning) present. No posterior oropharyngeal erythema. No tonsillar exudate.  Cardiovascular: Normal rate, regular rhythm, normal heart sounds and intact distal pulses.  Pulmonary/Chest: Effort normal and breath sounds normal.  Abdominal: Soft. There is no tenderness.  Lymphadenopathy:    He has no cervical adenopathy.  Neurological: He is alert and oriented to person, place, and time.  Psychiatric: He has a normal mood and affect. His behavior is normal.        Assessment & Plan:   Problem List Items Addressed This Visit    None    Visit Diagnoses    Acute rhinosinusitis    -  Primary   Relevant Medications   fluticasone (FLONASE) 50 MCG/ACT nasal spray      Acute rhinosinusitis Patient told to take Flonase 2 times per day as well as Mucinex and other nasal sprays. He can take Benadryl at night. He was told to call back in 5 to 7 days if he is not feeling any better.  Follow up plan: Return if symptoms worsen or fail to improve.  Counseling provided for all of the vaccine components No orders of the defined types were placed in this encounter.  Patient seen and examined with Rejeana Brock, PA student.  Agree with assessment and plan above. Caryl Pina, MD [facility] 11/17/2017, 10:39 AM

## 2018-01-25 ENCOUNTER — Telehealth: Payer: Self-pay | Admitting: *Deleted

## 2018-01-27 ENCOUNTER — Emergency Department (HOSPITAL_COMMUNITY): Payer: PRIVATE HEALTH INSURANCE

## 2018-01-27 ENCOUNTER — Observation Stay (HOSPITAL_COMMUNITY)
Admission: EM | Admit: 2018-01-27 | Discharge: 2018-01-28 | Disposition: A | Discharge status: Home / Self Care | Payer: PRIVATE HEALTH INSURANCE | Attending: Surgery | Admitting: Surgery

## 2018-01-27 ENCOUNTER — Other Ambulatory Visit: Payer: Self-pay

## 2018-01-27 ENCOUNTER — Encounter (HOSPITAL_COMMUNITY): Payer: Self-pay | Admitting: Emergency Medicine

## 2018-01-27 DIAGNOSIS — Z885 Allergy status to narcotic agent status: Secondary | ICD-10-CM | POA: Diagnosis not present

## 2018-01-27 DIAGNOSIS — S2241XA Multiple fractures of ribs, right side, initial encounter for closed fracture: Principal | ICD-10-CM | POA: Insufficient documentation

## 2018-01-27 DIAGNOSIS — W11XXXA Fall on and from ladder, initial encounter: Secondary | ICD-10-CM | POA: Diagnosis not present

## 2018-01-27 DIAGNOSIS — Z881 Allergy status to other antibiotic agents status: Secondary | ICD-10-CM | POA: Insufficient documentation

## 2018-01-27 DIAGNOSIS — S2249XA Multiple fractures of ribs, unspecified side, initial encounter for closed fracture: Secondary | ICD-10-CM | POA: Diagnosis present

## 2018-01-27 DIAGNOSIS — S2239XA Fracture of one rib, unspecified side, initial encounter for closed fracture: Secondary | ICD-10-CM

## 2018-01-27 DIAGNOSIS — Y990 Civilian activity done for income or pay: Secondary | ICD-10-CM | POA: Insufficient documentation

## 2018-01-27 MED ORDER — HYDROMORPHONE HCL 1 MG/ML IJ SOLN: 0.5000 mg | INTRAMUSCULAR | Status: DC | PRN

## 2018-01-27 MED ORDER — TRAMADOL HCL 50 MG PO TABS
50.0000 mg | ORAL_TABLET | Freq: Four times a day (QID) | ORAL | Status: DC | PRN
  Administered 2018-01-27: 50 mg via ORAL
  Filled 2018-01-27 (×2): qty 1

## 2018-01-27 MED ORDER — ENOXAPARIN SODIUM 40 MG/0.4ML ~~LOC~~ SOLN
40.0000 mg | SUBCUTANEOUS | Status: DC
  Administered 2018-01-27: 40 mg via SUBCUTANEOUS
  Filled 2018-01-27: qty 0.4

## 2018-01-27 MED ORDER — OXYCODONE-ACETAMINOPHEN 5-325 MG PO TABS
1.0000 | ORAL_TABLET | Freq: Once | ORAL | Status: AC
  Administered 2018-01-27: 1 via ORAL
  Filled 2018-01-27: qty 1

## 2018-01-27 MED ORDER — IBUPROFEN 200 MG PO TABS: 600.0000 mg | ORAL_TABLET | Freq: Four times a day (QID) | ORAL | Status: DC | PRN

## 2018-01-27 MED ORDER — ONDANSETRON 4 MG PO TBDP: 4.0000 mg | ORAL_TABLET | Freq: Four times a day (QID) | ORAL | Status: DC | PRN

## 2018-01-27 MED ORDER — ACETAMINOPHEN 500 MG PO TABS
1000.0000 mg | ORAL_TABLET | Freq: Four times a day (QID) | ORAL | Status: DC
  Administered 2018-01-27 – 2018-01-28 (×4): 1000 mg via ORAL
  Filled 2018-01-27 (×4): qty 2

## 2018-01-27 MED ORDER — ONDANSETRON HCL 4 MG/2ML IJ SOLN: 4.0000 mg | Freq: Four times a day (QID) | INTRAMUSCULAR | Status: DC | PRN

## 2018-01-27 MED ORDER — DOCUSATE SODIUM 100 MG PO CAPS: 200.0000 mg | ORAL_CAPSULE | Freq: Two times a day (BID) | ORAL | Status: DC

## 2018-01-27 MED ORDER — HYDRALAZINE HCL 20 MG/ML IJ SOLN: 10.0000 mg | INTRAMUSCULAR | Status: DC | PRN

## 2018-01-27 NOTE — ED Provider Notes (Signed)
57 year old male received at signout from [facility] pending trauma consult. Per his HPI:   "Patient presents to the emergency department with right-sided rib pain posteriorly.  The patient was seen in urgent care and told he had several rib fractures.  Patient states that certain movements outpatient make the pain worse.  The patient states that he had no other injuries.  Patient states it does hurt to take a deep breath.  Patient did not take any medications prior to arrival.  The patient denies chest pain, shortness of breath, headache,blurred vision, neck pain, fever, cough, weakness, numbness, dizziness, anorexia, edema, abdominal pain, nausea, vomiting, diarrhea, rash, back pain, dysuria, hematemesis, bloody stool, near syncope, or syncope."  Physical Exam  BP (!) 154/86 (BP Location: Right Arm)   Pulse 82   Temp 98.7 F (37.1 C) (Oral)   Resp 18   Ht 5\' 8"  (1.727 m)   Wt 78.1 kg   SpO2 99%   BMI 26.18 kg/m   Physical Exam   I did not examine this patient.   ED Course/Procedures     Procedures  MDM   57 year old male seen to signout from [facility] pending trauma surgery consult.  Trauma surgery will admit.       Joline Maxcy A, PA-C 01/27/18 1537    Malvin Johns, MD 01/28/18 [phone]

## 2018-01-27 NOTE — ED Provider Notes (Signed)
Patient fell today from a stepladder 8 AM.  He reports that his feet were approximately 2 feet off the ground.  His back struck the corner of the machine.  He complains of right-sided back pain approximately mid scapular line since event pain is located immediately inferior to the right scapula.  He denies shortness of breath.  Pain is worse with changing positions.  Pain improved since treated with Percocet while here.  On exam alert no distress lungs clear to auscultation heart regular rate and rhythm abdomen nondistended nontender.  Back is without tenderness along the spine.  He is tender at right infrascapular area no crepitus no flail   Orlie Dakin, MD 01/27/18 1533

## 2018-01-27 NOTE — ED Notes (Signed)
Attempted report x1. 

## 2018-01-27 NOTE — ED Notes (Signed)
Pt updated on bed assignment.

## 2018-01-27 NOTE — H&P (Signed)
Activation and Reason: Non level trauma  Primary Survey:  Airway: Intact Breathing: Spontaneous, unlabored; bilateral breath sounds Circulation: palpable pulses in all 4 ext Disability: GCS 15  Keith Shields is an 57 y.o. male.  HPI: Keith Shields is a very pleasant 75yoM with hx of migraines whom reports he fell at work at around 8 am this morning. Was about 2 feet off the ground coming down a latter when he missed his step and fell to the ground landing on right side. He denies hitting his head or losing consciousness. He was ambulatory on scene. He reports pain in his right posterior chest wall. He denies any pain anywhere else. He denies pain in his head, neck, shoulders, arms, hands, anterior chest, abdomen, pelvis, lower extremities, ankles, feet. He states the the posterior chest wall pain is worse with deep inspiration or palpation. He reports it as soreness. Does not radiate.  Past Medical History:  Diagnosis Date  . Migraines     Past Surgical History:  Procedure Laterality Date  . HERNIA REPAIR     Three times  . KNEE SURGERY Right   . LEG SURGERY Left     Family History  Problem Relation Age of Onset  . Dementia Mother   . Leukemia Father   . Cancer Sister        Colon  . Diabetes Brother     Social History:  reports that he has never smoked. He has never used smokeless tobacco. He reports that he drinks alcohol. He reports that he does not use drugs.  Allergies:  Allergies  Allergen Reactions  . Codeine Nausea Only  . Keflex [Cephalexin] Rash    Medications: I have reviewed the patient's current medications.  No results found for this or any previous visit (from the past 48 hour(s)).  Ct Chest Wo Contrast  Result Date: 01/27/2018 CLINICAL DATA:  Fall off ladder. Right-sided chest/rib pain. Rib fractures on outside radiograph. EXAM: CT CHEST WITHOUT CONTRAST TECHNIQUE: Multidetector CT imaging of the chest was performed following the standard protocol  without IV contrast. COMPARISON:  01/27/2018 rib radiographs. FINDINGS: Cardiovascular: Tortuous thoracic aorta. Normal heart size, without pericardial effusion. Aortic atherosclerosis. Lad and probable left main coronary artery calcification. No evidence of mediastinal hematoma. Mediastinum/Nodes: No mediastinal or definite hilar adenopathy, given limitations of unenhanced CT. Lungs/Pleura: Minimal right-sided pleural thickening. Bibasilar atelectasis. Upper Abdomen: Normal imaged portions of the liver, spleen, stomach, pancreas, gallbladder, adrenal glands, kidneys. No free intraperitoneal air. Musculoskeletal: Seventh through tenth posterior right rib fractures. The eighth and ninth fractures are segmental. Minimal comminution involving the tenth right rib fracture. Moderate compression deformity involving the T8 vertebral body, without well-defined paravertebral hematoma. Minimal ventral canal encroachment. IMPRESSION: 1. Right-sided rib fractures with mild right-sided pleural thickening, likely related to trace hemothorax. 2. No pneumothorax or other acute complication. 3. T8 compression deformity, favored to be nonacute, given absence of surrounding hematoma. Correlate with localizing symptoms. 4. Coronary artery atherosclerosis. Aortic Atherosclerosis (ICD10-I70.0). Electronically Signed   By: Abigail Miyamoto M.D.   On: 01/27/2018 15:47    Review of Systems  Constitutional: Negative for chills and fever.  HENT: Negative for ear pain and hearing loss.   Eyes: Negative for blurred vision and double vision.  Respiratory: Positive for cough. Negative for sputum production, shortness of breath and wheezing.        Posterior chest wall pain  Cardiovascular: Negative for palpitations.       Posterior chest wall pain  Gastrointestinal:  Negative for abdominal pain, nausea and vomiting.  Genitourinary: Negative for dysuria and hematuria.  Musculoskeletal: Negative for back pain and neck pain.       Right  posterior chest wall; denies midline back pain  Skin: Negative for itching and rash.  Neurological: Negative for focal weakness and loss of consciousness.  Psychiatric/Behavioral: Negative for depression and memory loss.   Blood pressure 134/89, pulse 91, temperature 98.8 F (37.1 C), temperature source Oral, resp. rate 18, height 5\' 8"  (1.727 m), weight 79.4 kg, SpO2 95 %. Physical Exam  Constitutional: He is oriented to person, place, and time. He appears well-developed and well-nourished.  HENT:  Head: Normocephalic and atraumatic.  Right Ear: External ear normal.  Left Ear: External ear normal.  Eyes: Pupils are equal, round, and reactive to light. Conjunctivae and EOM are normal.  Neck: Normal range of motion. Neck supple.  Cardiovascular: Normal rate, regular rhythm and intact distal pulses.  Respiratory: Effort normal. He has no wheezes. He exhibits tenderness.  Right posterior chest wall tenderness to palpation; no ecchymoses noted  GI: Soft. He exhibits no distension. There is no tenderness. There is no rebound and no guarding.  Musculoskeletal: Normal range of motion. He exhibits no deformity.  Neurological: He is alert and oriented to person, place, and time.  Skin: Skin is warm and dry.  Psychiatric: He has a normal mood and affect. His behavior is normal. Judgment and thought content normal.   Injury Summary: Right rib fractures - 7,8,9,10 with possible trace hemothorax associated; no pneumothorax  Plan: -Admit to ward for observation tonight -Repeat CXR in AM -IS 10x/hr while awake - currently pulling 1500 on IS -Multimodal pain control -If doing well tomorrow, will consider discharge home  Sharon Mt. Dema Severin, M.D. [facility] 01/27/2018, 5:07 PM

## 2018-01-27 NOTE — ED Triage Notes (Signed)
Pt arrives via EMS with reports of falling 2 foot of ladder this AM. Denies LOC. Pt went to urgent care where xrays showed posterior fx to 7,8,9 ribs.

## 2018-01-27 NOTE — ED Provider Notes (Signed)
[facility] EMERGENCY DEPARTMENT Provider Note   CSN: [id number] Arrival date & time: 01/27/18  1233     History   Chief Complaint Chief Complaint  Patient presents with  . Fall    HPI Keith Shields is a 57 y.o. male.  HPI Patient presents to the emergency department with right-sided rib pain posteriorly.  The patient was seen in urgent care and told he had several rib fractures.  Patient states that certain movements outpatient make the pain worse.  The patient states that he had no other injuries.  Patient states it does hurt to take a deep breath.  Patient did not take any medications prior to arrival.  The patient denies chest pain, shortness of breath, headache,blurred vision, neck pain, fever, cough, weakness, numbness, dizziness, anorexia, edema, abdominal pain, nausea, vomiting, diarrhea, rash, back pain, dysuria, hematemesis, bloody stool, near syncope, or syncope. Past Medical History:  Diagnosis Date  . Migraines     There are no active problems to display for this patient.   Past Surgical History:  Procedure Laterality Date  . HERNIA REPAIR     Three times  . KNEE SURGERY Right   . LEG SURGERY Left         Home Medications    Prior to Admission medications   Medication Sig Start Date End Date Taking? Authorizing Provider  amoxicillin-clavulanate (AUGMENTIN) 875-125 MG tablet Take 1 tablet by mouth 2 (two) times daily. Take all of this medication 03/18/17   Claretta Fraise, MD  fluticasone [facility]) 50 MCG/ACT nasal spray Place 1 spray into both nostrils 2 (two) times daily as needed for allergies or rhinitis. 11/17/17   Dettinger, Fransisca Kaufmann, MD    Family History Family History  Problem Relation Age of Onset  . Dementia Mother   . Leukemia Father   . Cancer Sister        Colon  . Diabetes Brother     Social History Social History   Tobacco Use  . Smoking status: Never Smoker  . Smokeless tobacco: Never Used  Substance Use  Topics  . Alcohol use: Yes    Comment: some  . Drug use: No     Allergies   Codeine and Keflex [cephalexin]   Review of Systems Review of Systems All other systems negative except as documented in the HPI. All pertinent positives and negatives as reviewed in the HPI.  Physical Exam Updated Vital Signs BP 134/89   Pulse 91   Temp 98.8 F (37.1 C) (Oral)   Resp 18   Ht 5\' 8"  (1.727 m)   Wt 79.4 kg   SpO2 95%   BMI 26.61 kg/m   Physical Exam  Constitutional: He is oriented to person, place, and time. He appears well-developed and well-nourished. No distress.  HENT:  Head: Normocephalic and atraumatic.  Mouth/Throat: Oropharynx is clear and moist.  Eyes: Pupils are equal, round, and reactive to light.  Neck: Normal range of motion. Neck supple.  Cardiovascular: Normal rate, regular rhythm and normal heart sounds. Exam reveals no gallop and no friction rub.  No murmur heard. Pulmonary/Chest: Effort normal and breath sounds normal. No respiratory distress. He has no wheezes.     He exhibits tenderness.  Abdominal: Soft. Bowel sounds are normal. He exhibits no distension. There is no tenderness.  Neurological: He is alert and oriented to person, place, and time. He exhibits normal muscle tone. Coordination normal.  Skin: Skin is warm and dry. Capillary refill takes less  than 2 seconds. No rash noted. No erythema.  Psychiatric: He has a normal mood and affect. His behavior is normal.  Nursing note and vitals reviewed.    ED Treatments / Results  Labs (all labs ordered are listed, but only abnormal results are displayed) Labs Reviewed - No data to display  EKG None  Radiology Ct Chest Wo Contrast  Result Date: 01/27/2018 CLINICAL DATA:  Fall off ladder. Right-sided chest/rib pain. Rib fractures on outside radiograph. EXAM: CT CHEST WITHOUT CONTRAST TECHNIQUE: Multidetector CT imaging of the chest was performed following the standard protocol without IV contrast.  COMPARISON:  01/27/2018 rib radiographs. FINDINGS: Cardiovascular: Tortuous thoracic aorta. Normal heart size, without pericardial effusion. Aortic atherosclerosis. Lad and probable left main coronary artery calcification. No evidence of mediastinal hematoma. Mediastinum/Nodes: No mediastinal or definite hilar adenopathy, given limitations of unenhanced CT. Lungs/Pleura: Minimal right-sided pleural thickening. Bibasilar atelectasis. Upper Abdomen: Normal imaged portions of the liver, spleen, stomach, pancreas, gallbladder, adrenal glands, kidneys. No free intraperitoneal air. Musculoskeletal: Seventh through tenth posterior right rib fractures. The eighth and ninth fractures are segmental. Minimal comminution involving the tenth right rib fracture. Moderate compression deformity involving the T8 vertebral body, without well-defined paravertebral hematoma. Minimal ventral canal encroachment. IMPRESSION: 1. Right-sided rib fractures with mild right-sided pleural thickening, likely related to trace hemothorax. 2. No pneumothorax or other acute complication. 3. T8 compression deformity, favored to be nonacute, given absence of surrounding hematoma. Correlate with localizing symptoms. 4. Coronary artery atherosclerosis. Aortic Atherosclerosis (ICD10-I70.0). Electronically Signed   By: Abigail Miyamoto M.D.   On: 01/27/2018 15:47    Procedures Procedures (including critical care time)  Medications Ordered in ED Medications  oxyCODONE-acetaminophen (PERCOCET/ROXICET) 5-325 MG per tablet 1 tablet (1 tablet Oral Given 01/27/18 1417)     Initial Impression / Assessment and Plan / ED Course  I have reviewed the triage vital signs and the nursing notes.  Pertinent labs & imaging results that were available during my care of the patient were reviewed by me and considered in my medical decision making (see chart for details).    Spoke with trauma who will come and evaluate the patient for possible observation  admission.  Patient is made aware and all questions were answered.  Final Clinical Impressions(s) / ED Diagnoses   Final diagnoses:  None    ED Discharge Orders    None       Dalia Heading, PA-C 01/27/18 [place], MD 01/27/18 [phone]

## 2018-01-27 NOTE — Plan of Care (Signed)
  Problem: Pain Managment: Goal: General experience of comfort will improve Outcome: Progressing   

## 2018-01-27 NOTE — ED Notes (Addendum)
Incentive Spirometry education done by RT

## 2018-01-28 ENCOUNTER — Observation Stay (HOSPITAL_COMMUNITY): Payer: PRIVATE HEALTH INSURANCE

## 2018-01-28 LAB — HIV ANTIBODY (ROUTINE TESTING W REFLEX): HIV Screen 4th Generation wRfx: NONREACTIVE

## 2018-01-28 MED ORDER — HYDROCODONE-ACETAMINOPHEN 5-325 MG PO TABS: 1.0000 | ORAL_TABLET | Freq: Four times a day (QID) | ORAL | 0 refills | Status: DC | PRN

## 2018-01-28 NOTE — Progress Notes (Signed)
Pt feeling better, walked to the hall this morning. Right rib cage pain noted with deep breathing. Encouraged pt to use Incentive sp more often.  Discharge instruction was given to pt, verbalized understanding. Discharged to home accompanied by wife.

## 2018-01-28 NOTE — Discharge Instructions (Signed)

## 2018-01-28 NOTE — Discharge Summary (Signed)
Physician Discharge Summary  Patient ID: Keith Shields MRN: [id number] DOB/AGE: 07/03/1960 57 y.o.  PCP: Dettinger, Fransisca Kaufmann, MD  Admit date: 01/27/2018 Discharge date: 01/28/2018  Admission Diagnoses:  Fall with right rib fractures  Discharge Diagnoses:  same  Active Problems:   Rib fractures   Surgery:  none  Discharged Condition: improved  Hospital Course:   Admitted on Black Friday after falling off step ladder and striking a fan on the right side.  He was admitted and xrays did not show a pneumothorax.  Xray on Saturday was stable and the patient wanted to go home.    Consults: none  Significant Diagnostic Studies: Chest xrays    Discharge Exam: Blood pressure (!) 143/89, pulse 69, temperature 97.7 F (36.5 C), temperature source Oral, resp. rate 18, height 5\' 8"  (1.727 m), weight 78.1 kg, SpO2 96 %. BS equal bilaterally  Disposition: Discharge disposition: 01-Home or Self Care       Discharge Instructions    Call MD for:   Complete by:  As directed    Shortness of breath or increasing pain on the right.   Call MD for:  temperature >100.4   Complete by:  As directed    Diet - low sodium heart healthy   Complete by:  As directed    Increase activity slowly   Complete by:  As directed      Allergies as of 01/28/2018      Reactions   Codeine Nausea Only   Keflex [cephalexin] Rash      Medication List    TAKE these medications   amoxicillin-clavulanate 875-125 MG tablet Commonly known as:  AUGMENTIN Take 1 tablet by mouth 2 (two) times daily. Take all of this medication   fluticasone 50 MCG/ACT nasal spray Commonly known as:  FLONASE Place 1 spray into both nostrils 2 (two) times daily as needed for allergies or rhinitis.   HYDROcodone-acetaminophen 5-325 MG tablet Commonly known as:  NORCO/VICODIN Take 1 tablet by mouth every 6 (six) hours as needed for moderate pain.      Follow-up Information    [facility]. Schedule an  appointment as soon as possible for a visit in 1 week(s).   Contact information: [place] [address] [facility] .   Contact information: [address] [id number] [place] [phone]          Signed: Pedro Earls 01/28/2018, 11:46 AM

## 2018-03-02 NOTE — Telephone Encounter (Signed)
lmtcb for flu shot 

## 2018-03-18 ENCOUNTER — Ambulatory Visit: Payer: Managed Care, Other (non HMO) | Admitting: Family Medicine

## 2018-03-18 VITALS — BP 146/79 | HR 78 | Temp 97.1°F | Wt 178.4 lb

## 2018-03-18 DIAGNOSIS — J029 Acute pharyngitis, unspecified: Secondary | ICD-10-CM | POA: Diagnosis not present

## 2018-03-18 MED ORDER — FLUTICASONE PROPIONATE 50 MCG/ACT NA SUSP: 2.0000 | Freq: Every day | NASAL | 6 refills | Status: DC

## 2018-03-18 NOTE — Patient Instructions (Signed)
Sore Throat  When you have a sore throat, your throat may feel:  · Tender.  · Burning.  · Irritated.  · Scratchy.  · Painful when you swallow.  · Painful when you talk.  Many things can cause a sore throat, such as:  · An infection.  · Allergies.  · Dry air.  · Smoke or pollution.  · Radiation treatment.  · Gastroesophageal reflux disease (GERD).  · A tumor.  A sore throat can be the first sign of another sickness. It can happen with other problems, like:  · Coughing.  · Sneezing.  · Fever.  · Swelling in the neck.  Most sore throats go away without treatment.  Follow these instructions at home:         · Take over-the-counter medicines only as told by your doctor.  ? If your child has a sore throat, do not give your child aspirin.  · Drink enough fluids to keep your pee (urine) pale yellow.  · Rest when you feel you need to.  · To help with pain:  ? Sip warm liquids, such as broth, herbal tea, or warm water.  ? Eat or drink cold or frozen liquids, such as frozen ice pops.  ? Gargle with a salt-water mixture 3-4 times a day or as needed. To make a salt-water mixture, add ½-1 tsp (3-6 g) of salt to 1 cup (237 mL) of warm water. Mix it until you cannot see the salt anymore.  ? Suck on hard candy or throat lozenges.  ? Put a cool-mist humidifier in your bedroom at night.  ? Sit in the bathroom with the door closed for 5-10 minutes while you run hot water in the shower.  · Do not use any products that contain nicotine or tobacco, such as cigarettes, e-cigarettes, and chewing tobacco. If you need help quitting, ask your doctor.  · Wash your hands well and often with soap and water. If soap and water are not available, use hand sanitizer.  Contact a doctor if:  · You have a fever for more than 2-3 days.  · You keep having symptoms for more than 2-3 days.  · Your throat does not get better in 7 days.  · You have a fever and your symptoms suddenly get worse.  · Your child who is 3 months to 3 years old has a temperature of  102.2°F (39°C) or higher.  Get help right away if:  · You have trouble breathing.  · You cannot swallow fluids, soft foods, or your saliva.  · You have swelling in your throat or neck that gets worse.  · You keep feeling sick to your stomach (nauseous).  · You keep throwing up (vomiting).  Summary  · A sore throat is pain, burning, irritation, or scratchiness in the throat. Many things can cause a sore throat.  · Take over-the-counter medicines only as told by your doctor. Do not give your child aspirin.  · Drink plenty of fluids, and rest as needed.  · Contact a doctor if your symptoms get worse or your sore throat does not get better within 7 days.  This information is not intended to replace advice given to you by your health care provider. Make sure you discuss any questions you have with your health care provider.  Document Released: 11/25/2007 Document Revised: 07/18/2017 Document Reviewed: 07/18/2017  [employer] Interactive Patient Education © 2019 [employer]

## 2018-03-18 NOTE — Progress Notes (Signed)
Keith Shields is a 58 y.o. male presenting with a sore throat for 1 days. States he woke up this morning with sore throat, slight swelling in his throat, and some postnasal drainage.   Associated symptoms include:  nasal/sinus congestion.  Symptoms are constant. Has had this several times in the past. States this happens about every other month. He denies fever or chills.   Home treatment thus far includes:  None.  No known sick contacts with similar symptoms.  There is no history of of similar symptoms.  History, medications, and allergies reviewed.   Review of Systems  Constitutional: Negative for chills, fever and malaise/fatigue.  HENT: Positive for sore throat.        Postnasal drainage  Respiratory: Negative for cough, sputum production and shortness of breath.   Cardiovascular: Negative for chest pain.  Musculoskeletal: Negative for myalgias and neck pain.  Neurological: Negative for dizziness, weakness and headaches.  All other systems reviewed and are negative.    Exam:  BP (!) 146/79   Pulse 78   Temp (!) 97.1 F (36.2 C) (Oral)   Wt 178 lb 6.4 oz (80.9 kg)   BMI 27.13 kg/m  Physical Exam Vitals signs and nursing note reviewed.  Constitutional:      General: He is not in acute distress.    Appearance: He is well-developed and well-groomed. He is not ill-appearing or toxic-appearing.  HENT:     Head: Normocephalic and atraumatic.     Right Ear: Hearing, tympanic membrane, ear canal and external ear normal.     Left Ear: Hearing, tympanic membrane, ear canal and external ear normal.     Nose: No congestion or rhinorrhea.     Right Sinus: No maxillary sinus tenderness or frontal sinus tenderness.     Left Sinus: No maxillary sinus tenderness or frontal sinus tenderness.     Mouth/Throat:     Lips: Pink.     Mouth: Mucous membranes are moist. No oral lesions.     Palate: No mass and lesions.     Pharynx: Pharyngeal swelling and posterior oropharyngeal  erythema present. No oropharyngeal exudate or uvula swelling.     Tonsils: No tonsillar exudate or tonsillar abscesses. Swelling: 3+ on the right. 3+ on the left.     Comments: Postnasal drainage  Eyes:     General: Lids are normal.     Conjunctiva/sclera: Conjunctivae normal.     Pupils: Pupils are equal, round, and reactive to light.  Neck:     Musculoskeletal: Normal range of motion and neck supple. No neck rigidity or muscular tenderness.  Cardiovascular:     Rate and Rhythm: Normal rate and regular rhythm.     Heart sounds: No murmur. No friction rub. No gallop.   Pulmonary:     Effort: Pulmonary effort is normal. No respiratory distress.     Breath sounds: Normal breath sounds.  Lymphadenopathy:     Cervical: No cervical adenopathy.  Skin:    General: Skin is warm and dry.     Capillary Refill: Capillary refill takes less than 2 seconds.  Neurological:     General: No focal deficit present.     Mental Status: He is alert and oriented to person, place, and time.  Psychiatric:        Mood and Affect: Mood normal.        Behavior: Behavior normal. Behavior is cooperative.    Rapid strep negative in office.   [name] was seen  today for sore throat.  Diagnoses and all orders for this visit:  Sore throat Due to recurrence of symptoms, will refer to ENT.  -     Rapid Strep Screen ([facility] ONLY) -     Ambulatory referral to ENT -     fluticasone (FLONASE) 50 MCG/ACT nasal spray; Place 2 sprays into both nostrils daily.  Viral pharyngitis Symptomatic care discussed. Warm salt water gargles. Throat lozenges if beneficial. Report any new or worsening symptoms.   Return if symptoms worsen or fail to improve.  The above assessment and management plan was discussed with the patient. The patient verbalized understanding of and has agreed to the management plan. Patient is aware to call the clinic if symptoms fail to improve or worsen. Patient is aware when to return to the  clinic for a follow-up visit. Patient educated on when it is appropriate to go to the emergency department.   Monia Pouch, FNP-C [facility] [address] [place], [place] [address] [phone]

## 2018-03-20 LAB — CULTURE, GROUP A STREP

## 2018-03-20 LAB — RAPID STREP SCREEN (MED CTR MEBANE ONLY): Strep Gp A Ag, IA W/Reflex: NEGATIVE

## 2018-04-29 ENCOUNTER — Encounter: Payer: Self-pay | Admitting: Family Medicine

## 2018-04-29 ENCOUNTER — Ambulatory Visit: Payer: Managed Care, Other (non HMO) | Admitting: Family Medicine

## 2018-04-29 VITALS — BP 152/100 | HR 87 | Temp 97.6°F | Ht 68.0 in | Wt 182.2 lb

## 2018-04-29 DIAGNOSIS — J351 Hypertrophy of tonsils: Secondary | ICD-10-CM | POA: Diagnosis not present

## 2018-04-29 DIAGNOSIS — J029 Acute pharyngitis, unspecified: Secondary | ICD-10-CM

## 2018-04-29 MED ORDER — FAMOTIDINE 20 MG PO TABS: 20.0000 mg | ORAL_TABLET | Freq: Two times a day (BID) | ORAL | 1 refills | Status: DC

## 2018-04-29 NOTE — Progress Notes (Signed)
BP (!) 152/100 (BP Location: Right Arm, Patient Position: Sitting, Cuff Size: Normal)   Pulse 87   Temp 97.6 F (36.4 C)   Ht 5\' 8"  (1.727 m)   Wt 182 lb 3.2 oz (82.6 kg)   BMI 27.70 kg/m    Subjective:    Patient ID: VI WHITESEL, male    DOB: 12/19/1960, 58 y.o.   MRN: [id number]  HPI: Keith Shields is a 58 y.o. male presenting on 04/29/2018 for Sore Throat (recurrent swollen and sore soft palate and uvula. )   HPI Patient comes in complaining of sore throat and congestion and swelling in the back of his throat and tonsils and soft palate that is been going on over the past 12 hours this time, he had a similar episode a month ago that was tested for strep but he sent to ENT and they said it might be acid reflux related, he does complain of some belching and burping and indigestion coming up into his chest and burning.  He has been using Mucinex for congestion that he started a week ago and the last time he is using Mucinex as well but he is used Mucinex many times before in his life.  He says he still has some nasal congestion but it is mostly cleared and the Mucinex is helping that.  He denies any fevers or chills or shortness of breath or closing up of his throat.  He is not on any other medications besides Flonase.  Relevant past medical, surgical, family and social history reviewed and updated as indicated. Interim medical history since our last visit reviewed. Allergies and medications reviewed and updated.  Review of Systems  Constitutional: Negative for chills and fever.  HENT: Positive for congestion, postnasal drip and sore throat. Negative for ear discharge, ear pain, rhinorrhea, sinus pressure, sneezing, trouble swallowing and voice change.   Eyes: Negative for pain, discharge, redness and visual disturbance.  Respiratory: Negative for cough, shortness of breath and wheezing.   Cardiovascular: Negative for chest pain and leg swelling.  Musculoskeletal: Negative for gait  problem.  Skin: Negative for rash.  All other systems reviewed and are negative.   Per HPI unless specifically indicated above   Allergies as of 04/29/2018      Reactions   Codeine Nausea Only   Keflex [cephalexin] Rash      Medication List       Accurate as of April 29, 2018  9:11 AM. Always use your most recent med list.        famotidine 20 MG tablet Commonly known as:  PEPCID Take 1 tablet (20 mg total) by mouth 2 (two) times daily.   fluticasone 50 MCG/ACT nasal spray Commonly known as:  FLONASE Place 2 sprays into both nostrils daily.   HYDROcodone-acetaminophen 5-325 MG tablet Commonly known as:  NORCO/VICODIN Take 1 tablet by mouth every 6 (six) hours as needed for moderate pain.          Objective:    BP (!) 152/100 (BP Location: Right Arm, Patient Position: Sitting, Cuff Size: Normal)   Pulse 87   Temp 97.6 F (36.4 C)   Ht 5\' 8"  (1.727 m)   Wt 182 lb 3.2 oz (82.6 kg)   BMI 27.70 kg/m   Wt Readings from Last 3 Encounters:  04/29/18 182 lb 3.2 oz (82.6 kg)  03/18/18 178 lb 6.4 oz (80.9 kg)  01/27/18 172 lb 2.9 oz (78.1 kg)    Physical Exam Vitals  signs and nursing note reviewed.  Constitutional:      General: He is not in acute distress.    Appearance: He is well-developed. He is not diaphoretic.  HENT:     Mouth/Throat:     Mouth: Mucous membranes are moist. No oral lesions.     Pharynx: Pharyngeal swelling and posterior oropharyngeal erythema present. No oropharyngeal exudate.     Tonsils: No tonsillar exudate or tonsillar abscesses. Swelling: 2+ on the right. 2+ on the left.  Eyes:     General: No scleral icterus.       Right eye: No discharge.     Conjunctiva/sclera: Conjunctivae normal.     Pupils: Pupils are equal, round, and reactive to light.  Neck:     Musculoskeletal: Neck supple.     Thyroid: No thyromegaly.  Cardiovascular:     Rate and Rhythm: Normal rate and regular rhythm.     Heart sounds: Normal heart sounds. No  murmur.  Pulmonary:     Effort: Pulmonary effort is normal. No respiratory distress.     Breath sounds: Normal breath sounds. No wheezing.  Musculoskeletal: Normal range of motion.  Lymphadenopathy:     Cervical: No cervical adenopathy.  Skin:    General: Skin is warm and dry.     Findings: No rash.  Neurological:     Mental Status: He is alert and oriented to person, place, and time.     Coordination: Coordination normal.  Psychiatric:        Behavior: Behavior normal.     Results for orders placed or performed in visit on 03/18/18  Rapid Strep Screen ([facility] ONLY)  Result Value Ref Range   Strep Gp A Ag, IA W/Reflex Negative Negative  Culture, Group A Strep  Result Value Ref Range   Strep A Culture CANCELED       Assessment & Plan:   Problem List Items Addressed This Visit    None    Visit Diagnoses    Sore throat    -  Primary   Relevant Medications   famotidine (PEPCID) 20 MG tablet   Swelling of tonsil       Relevant Medications   famotidine (PEPCID) 20 MG tablet       Likely GERD versus viral pharyngitis, recommended Pepcid and Benadryl and continue the Mucinex.  Let us know if you develop any high fevers  Follow up plan: Return if symptoms worsen or fail to improve.  Counseling provided for all of the vaccine components No orders of the defined types were placed in this encounter.   Caryl Pina, MD [facility] 04/29/2018, 9:11 AM

## 2018-10-24 ENCOUNTER — Encounter: Payer: Self-pay | Admitting: Nurse Practitioner

## 2018-10-24 ENCOUNTER — Ambulatory Visit (INDEPENDENT_AMBULATORY_CARE_PROVIDER_SITE_OTHER): Payer: Managed Care, Other (non HMO) | Admitting: Nurse Practitioner

## 2018-10-24 DIAGNOSIS — R197 Diarrhea, unspecified: Secondary | ICD-10-CM

## 2018-10-24 MED ORDER — CIPROFLOXACIN HCL 500 MG PO TABS: 500.0000 mg | ORAL_TABLET | Freq: Two times a day (BID) | ORAL | 0 refills | Status: DC

## 2018-10-24 NOTE — Progress Notes (Signed)
   Virtual Visit via telephone Note Due to COVID-19 pandemic this visit was conducted virtually. This visit type was conducted due to national recommendations for restrictions regarding the COVID-19 Pandemic (e.g. social distancing, sheltering in place) in an effort to limit this patient's exposure and mitigate transmission in our community. All issues noted in this document were discussed and addressed.  A physical exam was not performed with this format.  I connected with Keith Shields on 10/24/18 at 4:05 by telephone and verified that I am speaking with the correct person using two identifiers. Keith Shields is currently located at home and no one is currently with him during visit. The provider, Mary-Margaret Hassell Done, FNP is located in their office at time of visit.  I discussed the limitations, risks, security and privacy concerns of performing an evaluation and management service by telephone and the availability of in person appointments. I also discussed with the patient that there may be a patient responsible charge related to this service. The patient expressed understanding and agreed to proceed.   History and Present Illness:   Chief Complaint: Diarrhea   HPI Patient calls in c/o diarrhea for over 4 days. He has been talking pepto bismol with no relief. No crimaping, no fever. Going 3-4 times a dy.    Review of Systems  Constitutional: Negative for diaphoresis and weight loss.  Eyes: Negative for blurred vision, double vision and pain.  Respiratory: Negative for shortness of breath.   Cardiovascular: Negative for chest pain, palpitations, orthopnea and leg swelling.  Gastrointestinal: Positive for diarrhea. Negative for abdominal pain, blood in stool, nausea and vomiting.  Skin: Negative for rash.  Neurological: Negative for dizziness, sensory change, loss of consciousness, weakness and headaches.  Endo/Heme/Allergies: Negative for polydipsia. Does not bruise/bleed easily.   Psychiatric/Behavioral: Negative for memory loss. The patient does not have insomnia.   All other systems reviewed and are negative.    Observations/Objective: Alert and oriented- answers all questions appropriately No distress  Assessment and Plan: [name] in today with chief complaint of Diarrhea   1. Diarrhea, unspecified type Force fluids Imodium ad OTC Meds ordered this encounter  Medications  . ciprofloxacin (CIPRO) 500 MG tablet    Sig: Take 1 tablet (500 mg total) by mouth 2 (two) times daily.    Dispense:  10 tablet    Refill:  0    Order Specific Question:   Supervising Provider    Answer:   Caryl Pina A [id number]   If no improvement in 2-3 day slet [place] know.   Follow Up Instructions: prn    I discussed the assessment and treatment plan with the patient. The patient was provided an opportunity to ask questions and all were answered. The patient agreed with the plan and demonstrated an understanding of the instructions.   The patient was advised to call back or seek an in-person evaluation if the symptoms worsen or if the condition fails to improve as anticipated.  The above assessment and management plan was discussed with the patient. The patient verbalized understanding of and has agreed to the management plan. Patient is aware to call the clinic if symptoms persist or worsen. Patient is aware when to return to the clinic for a follow-up visit. Patient educated on when it is appropriate to go to the emergency department.   Time call ended:  4:15  I provided 10 minutes of non-face-to-face time during this encounter.    Mary-Margaret Hassell Done, FNP

## 2019-05-09 ENCOUNTER — Ambulatory Visit: Payer: Managed Care, Other (non HMO) | Admitting: Family Medicine

## 2019-05-09 ENCOUNTER — Encounter: Payer: Self-pay | Admitting: Family Medicine

## 2019-05-09 ENCOUNTER — Other Ambulatory Visit: Payer: Self-pay

## 2019-05-09 VITALS — BP 151/93 | HR 89 | Temp 98.9°F | Ht 68.0 in | Wt 180.0 lb

## 2019-05-09 DIAGNOSIS — M545 Low back pain, unspecified: Secondary | ICD-10-CM

## 2019-05-09 MED ORDER — PREDNISONE 10 MG (21) PO TBPK: ORAL_TABLET | ORAL | 0 refills | Status: DC

## 2019-05-09 MED ORDER — CYCLOBENZAPRINE HCL 10 MG PO TABS: 5.0000 mg | ORAL_TABLET | Freq: Three times a day (TID) | ORAL | 0 refills | Status: DC | PRN

## 2019-05-09 MED ORDER — METHYLPREDNISOLONE ACETATE 40 MG/ML IJ SUSP
40.0000 mg | Freq: Once | INTRAMUSCULAR | Status: AC
  Administered 2019-05-09: 17:00:00 40 mg via INTRAMUSCULAR
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
## 2020-01-18 DIAGNOSIS — Z20822 Contact with and (suspected) exposure to covid-19: Secondary | ICD-10-CM | POA: Diagnosis not present

## 2020-01-18 DIAGNOSIS — R197 Diarrhea, unspecified: Secondary | ICD-10-CM | POA: Diagnosis not present

## 2020-01-25 DIAGNOSIS — U071 COVID-19: Secondary | ICD-10-CM | POA: Diagnosis not present

## 2020-07-22 DIAGNOSIS — K219 Gastro-esophageal reflux disease without esophagitis: Secondary | ICD-10-CM | POA: Diagnosis not present

## 2020-09-22 IMAGING — CT CT CHEST W/O CM
2 of 3 series · 15 of 36 positions shown, 18 images · non-contrast
Comparison: 01/27/2018 rib radiographs.

CLINICAL DATA: Fall off ladder. Right-sided chest/rib pain. Rib
fractures on outside radiograph.

EXAM:
CT CHEST WITHOUT CONTRAST
TECHNIQUE: Multidetector CT imaging of the chest was performed following the
standard protocol without IV contrast.

[Series 3: chest w/o 2mm st · axial · non-contrast · 0.83mm/px · z∈[+986,+1252]mm · 12 of 157 slices shown, 15 images]
[im 12/157  mediastinal]
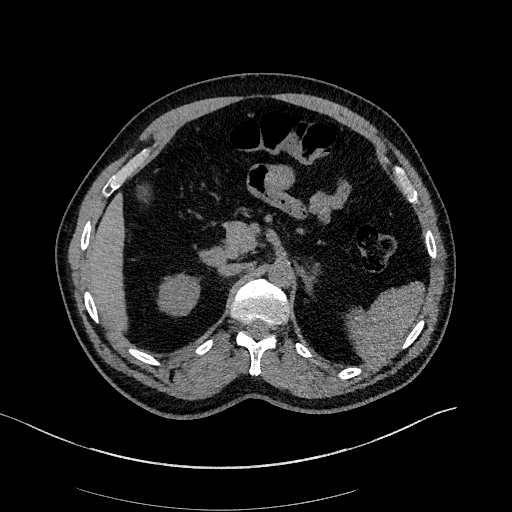
[im 12/157  lung]
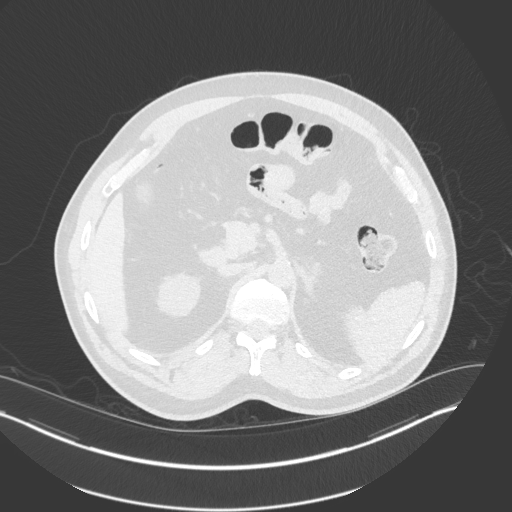
[im 24/157  lung]
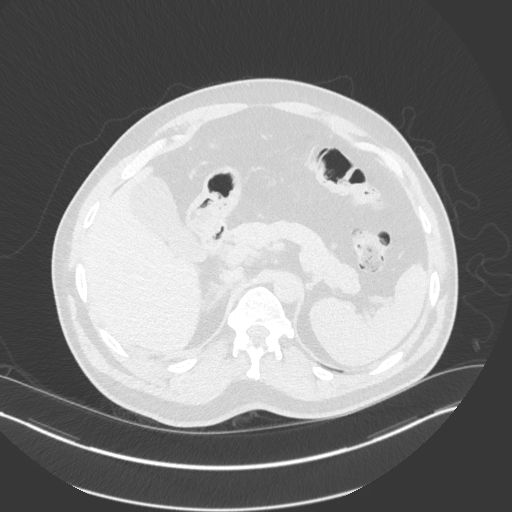
[im 35/157  lung]
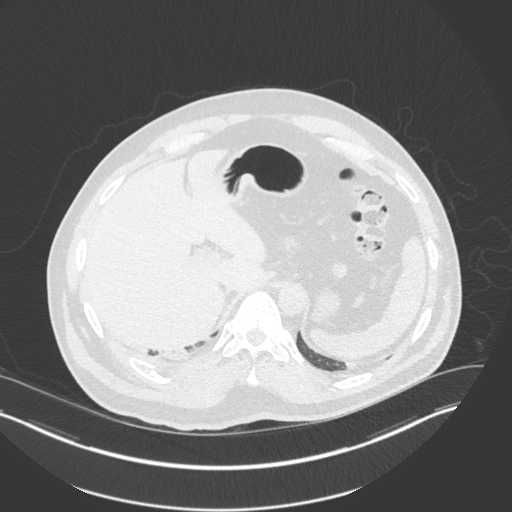
[im 47/157  lung]
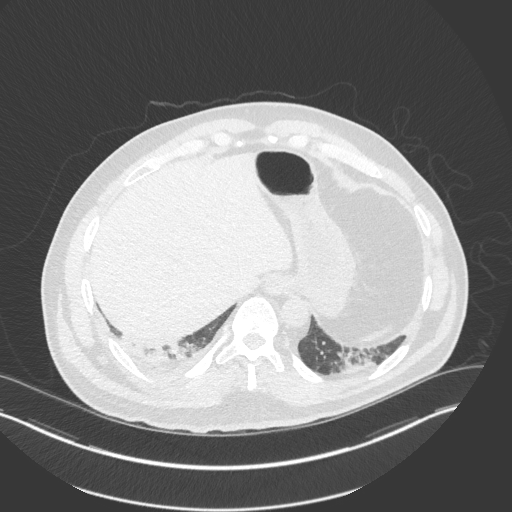
[im 58/157  mediastinal]
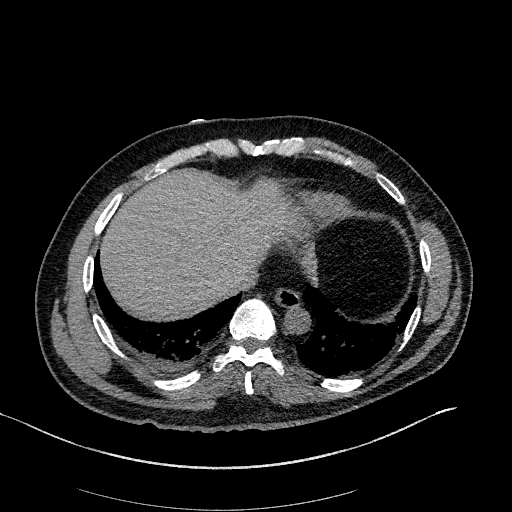
[im 58/157  lung]
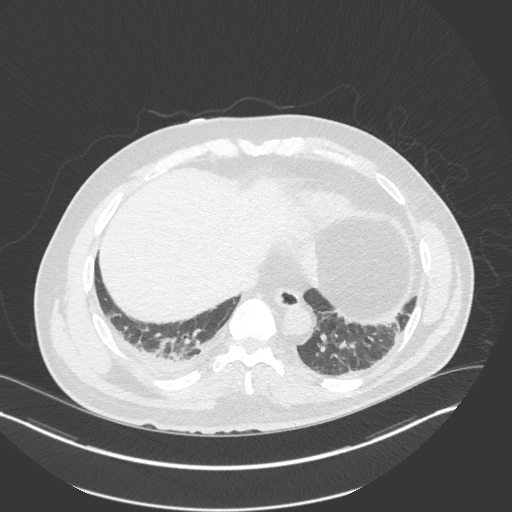
[im 70/157  lung]
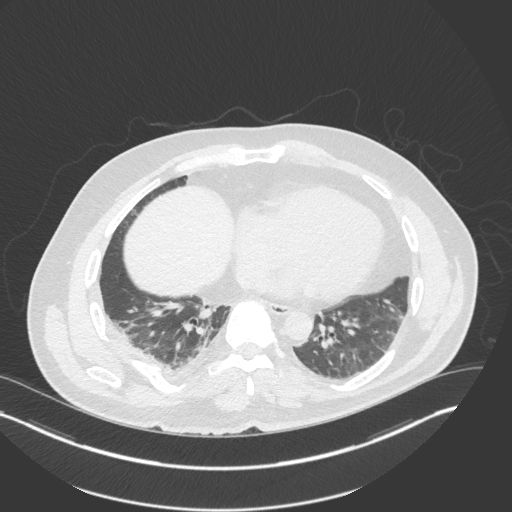
[im 87/157  lung]
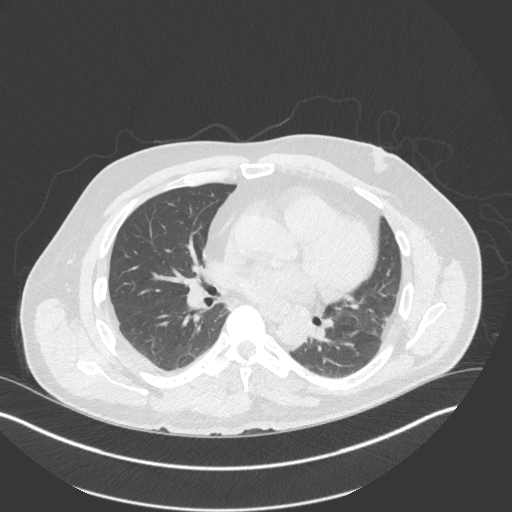
[im 99/157  lung]
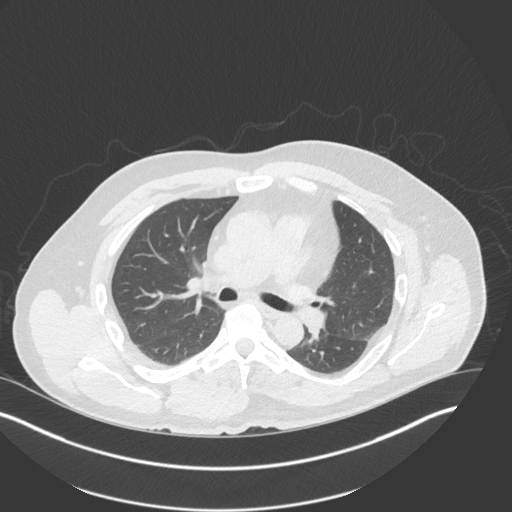
[im 110/157  mediastinal]
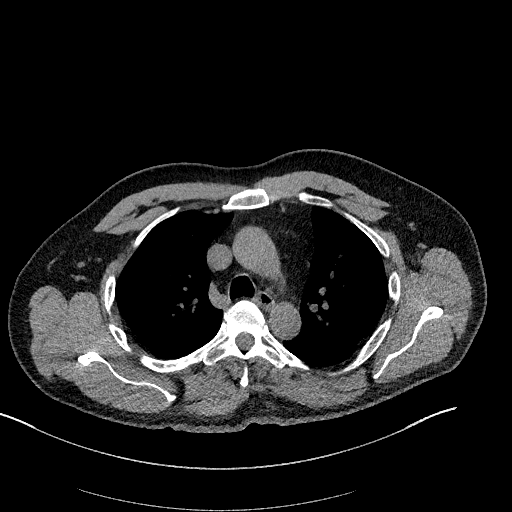
[im 110/157  lung]
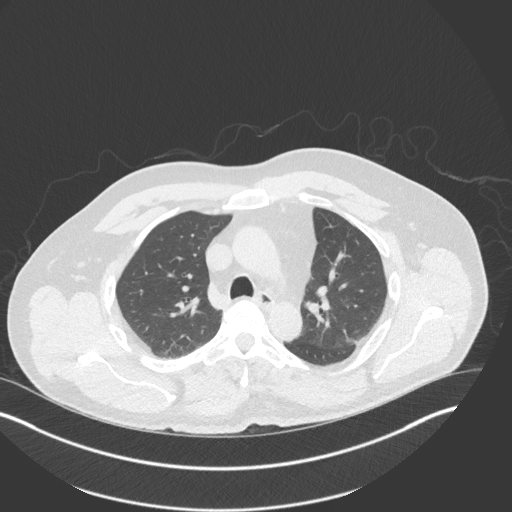
[im 122/157  lung]
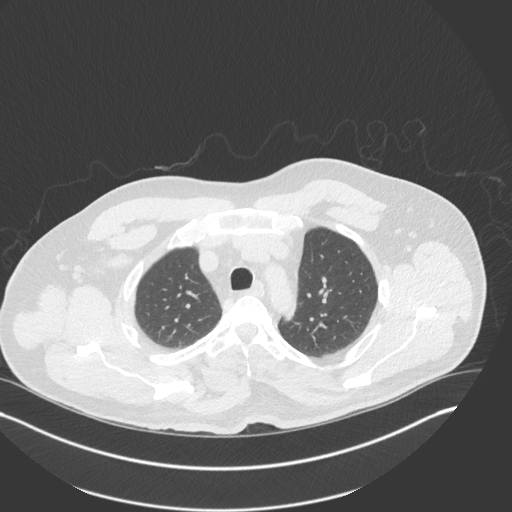
[im 133/157  lung]
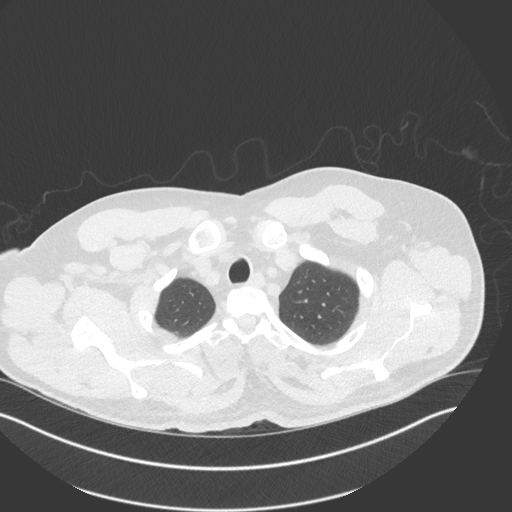
[im 145/157  lung]
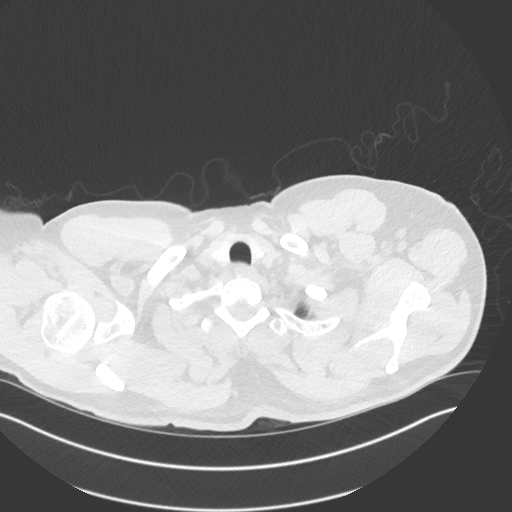

[Series 5: chest w/o 3mm st cor · coronal · non-contrast · 0.70mm/px · 3 of 105 slices shown]
[im 21/105  lung]
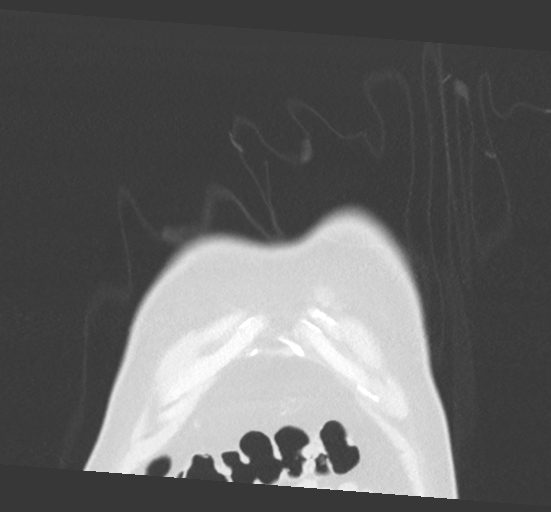
[im 42/105  lung]
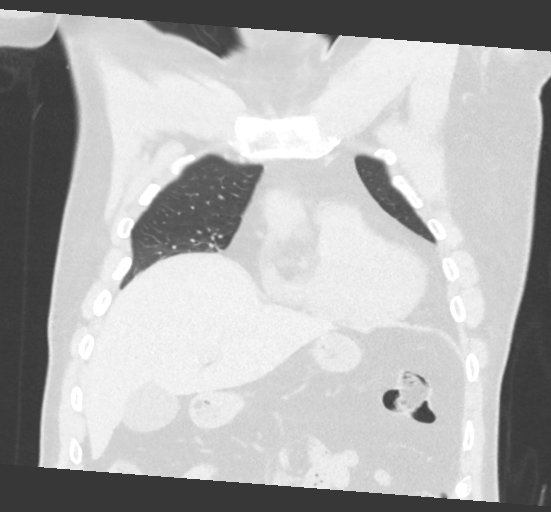
[im 63/105  lung]
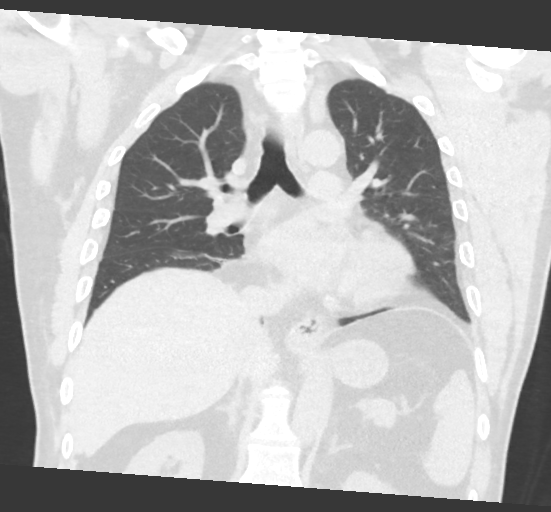

[15 of 36 positions shown; findings below may reference images not displayed]

FINDINGS: Cardiovascular: Tortuous thoracic aorta. Normal heart size, without
pericardial effusion. Aortic atherosclerosis. Lad and probable left
main coronary artery calcification. No evidence of mediastinal
hematoma.

Mediastinum/Nodes: No mediastinal or definite hilar adenopathy,
given limitations of unenhanced CT.

Lungs/Pleura: Minimal right-sided pleural thickening. Bibasilar
atelectasis.

Upper Abdomen: Normal imaged portions of the liver, spleen, stomach,
pancreas, gallbladder, adrenal glands, kidneys. No free
intraperitoneal air.

Musculoskeletal: Seventh through tenth posterior right rib
fractures. The eighth and ninth fractures are segmental. Minimal
comminution involving the tenth right rib fracture.

Moderate compression deformity involving the T8 vertebral body,
without well-defined paravertebral hematoma. Minimal ventral canal
encroachment.
IMPRESSION: 1. Right-sided rib fractures with mild right-sided pleural
thickening, likely related to trace hemothorax.
2. No pneumothorax or other acute complication.
3. T8 compression deformity, favored to be nonacute, given absence
of surrounding hematoma. Correlate with localizing symptoms.
4. Coronary artery atherosclerosis. Aortic Atherosclerosis
(MC5I8-CDE.E).

## 2020-11-14 ENCOUNTER — Ambulatory Visit: Payer: BC Managed Care – PPO | Admitting: Nurse Practitioner

## 2020-11-14 ENCOUNTER — Encounter: Payer: Self-pay | Admitting: Nurse Practitioner

## 2020-11-14 ENCOUNTER — Other Ambulatory Visit: Payer: Self-pay

## 2020-11-14 VITALS — BP 138/88 | HR 71 | Temp 97.6°F | Ht 68.0 in | Wt 169.0 lb

## 2020-11-14 DIAGNOSIS — H10013 Acute follicular conjunctivitis, bilateral: Secondary | ICD-10-CM | POA: Diagnosis not present

## 2020-11-14 DIAGNOSIS — H1131 Conjunctival hemorrhage, right eye: Secondary | ICD-10-CM | POA: Insufficient documentation

## 2020-11-14 NOTE — Patient Instructions (Signed)
Subconjunctival Hemorrhage Subconjunctival hemorrhage is bleeding that happens between the white part of your eye (sclera) and the clear membrane that covers the outside of your eye (conjunctiva). There are many tiny blood vessels near the surface of your eye. A subconjunctival hemorrhage happens when one or more of these vessels breaks and bleeds, causing a red patch to appear on your eye. This is similar to a bruise. Depending on the amount of bleeding, the red patch may only cover a small area of your eye or it may cover the entire visible part of the sclera. If a lot of blood collects under the conjunctiva, there may also be swelling. Subconjunctival hemorrhages do not affect your vision or cause pain, but your eye may feel irritated if there is swelling. Subconjunctival hemorrhages usually do not require treatment, and they usually disappear on their own within two to four weeks. What are the causes? This condition may be caused by: Mild trauma, such as rubbing your eye too hard. Blunt injuries, such as from playing sports or coming into contact with a deployed airbag. Coughing, sneezing, or vomiting. Straining, such as when lifting a heavy object. Medical conditions, such as: High blood pressure. Diabetes. Recent eye surgery. Certain medicines, especially blood thinners (anticoagulants), including aspirin. Other conditions, such as eye tumors, bleeding disorders, or blood vessel abnormalities. Subconjunctival hemorrhages can also happen without an obvious cause. What are the signs or symptoms? Symptoms of this condition include: A bright red or dark red patch on the white part of the eye. The red area may: Spread out to cover a larger area of the eye before it goes away. Turn colors such as pink or brownish-yellow before it goes away. Swelling around the eye. Mild eye irritation. How is this diagnosed? This condition is diagnosed with a physical exam. If your subconjunctival hemorrhage  was caused by trauma, your health care provider may refer you to an eye specialist (ophthalmologist) or another specialist to check for other injuries. You may have other tests, including: An eye exam including a vision test, checking your eye with a type of microscope (slit lamp) and measuring the pressure in your eye. Your eye may be dilated, especially if your subconjunctival hemorrhage was caused by trauma. A blood pressure check. Blood tests to check for bleeding disorders. If your subconjunctival hemorrhage was caused by trauma, X-rays or a CT scan may be done to check for other injuries. How is this treated? Usually, treatment is not needed for this condition. If you have discomfort, your health care provider may recommend eye drops or cold compresses. Follow these instructions at home: Take over-the-counter and prescription medicines only as directed by your health care provider. Use eye drops or cold compresses to help with discomfort as directed by your health care provider. Avoid activities, things, and environments that may irritate or injure your eye. Keep all follow-up visits. This is important. Contact a health care provider if: You have pain in your eye. The bleeding does not go away within 4 weeks. You keep getting new subconjunctival hemorrhages. Get help right away if: Your vision changes, you have difficulty seeing, or you develop double vision. You suddenly develop severe sensitivity to light. You develop a severe headache, persistent vomiting, confusion, or abnormal tiredness (lethargy). Your eye seems to bulge or protrude from your eye socket. You develop unexplained bruises on your body. You have unexplained bleeding in another area of your body. These symptoms may represent a serious problem that is an emergency. Do not   wait to see if the symptoms will go away. Get medical help right away. Call your local emergency services (911 in the [place] Do not drive yourself to  the hospital. Summary Subconjunctival hemorrhage is bleeding that happens between the white part of your eye and the clear membrane that covers the outside of your eye. This condition is similar to a bruise. Subconjunctival hemorrhages usually do not require treatment, and they usually disappear on their own within two to four weeks. Use eye drops or cold compresses to help with discomfort as directed by your health care provider. This information is not intended to replace advice given to you by your health care provider. Make sure you discuss any questions you have with your health care provider. Document Revised: 04/23/2020 Document Reviewed: 04/23/2020 Elsevier Patient Education  [place].

## 2020-11-14 NOTE — Assessment & Plan Note (Signed)
Injected sclera in the last 24 hours.  No sensation of object in the eye, no blurry vision, no fever, headache or impaired vision.  No injected sclera looks like a soft conjunctival hemorrhage.  Advised patient to walk in eye clinic for follow-up. Tylenol for pain, cool clean compress, follow-up with worsening unresolved symptoms.

## 2020-11-14 NOTE — Progress Notes (Signed)
Acute Office Visit  Subjective:    Patient ID: Keith Shields, male    DOB: 01-May-1960, 60 y.o.   MRN: [id number]  Chief Complaint  Patient presents with   Eye Problem    Eye Problem  The right eye is affected. This is a new problem. The current episode started yesterday. The problem occurs constantly. The problem has been unchanged. There was no injury mechanism. The patient is experiencing no pain. There is No known exposure to pink eye. Associated symptoms include eye redness. Pertinent negatives include no blurred vision, eye discharge, double vision, fever, foreign body sensation, nausea, photophobia or recent URI. He has tried nothing for the symptoms.    Past Medical History:  Diagnosis Date   Migraines     Past Surgical History:  Procedure Laterality Date   HERNIA REPAIR     Three times   KNEE SURGERY Right    LEG SURGERY Left     Family History  Problem Relation Age of Onset   Dementia Mother    Leukemia Father    Cancer Sister        Colon   Diabetes Brother     Social History   Socioeconomic History   Marital status: Married    Spouse name: Not on file   Number of children: Not on file   Years of education: Not on file   Highest education level: Not on file  Occupational History   Not on file  Tobacco Use   Smoking status: Never   Smokeless tobacco: Never  Substance and Sexual Activity   Alcohol use: Yes    Comment: some   Drug use: No   Sexual activity: Not on file  Other Topics Concern   Not on file  Social History Narrative   Not on file   Social Determinants of Health   Financial Resource Strain: Not on file  Food Insecurity: Not on file  Transportation Needs: Not on file  Physical Activity: Not on file  Stress: Not on file  Social Connections: Not on file  Intimate Partner Violence: Not on file    Outpatient Medications Prior to Visit  Medication Sig Dispense Refill   omeprazole (PRILOSEC) 20 MG capsule Take 20 mg by mouth 2  (two) times daily before a meal.     cyclobenzaprine (FLEXERIL) 10 MG tablet Take 0.5-1 tablets (5-10 mg total) by mouth 3 (three) times daily as needed for muscle spasms. 30 tablet 0   predniSONE (STERAPRED UNI-PAK 21 TAB) 10 MG (21) TBPK tablet As directed x 6 days (START TOMORROW 3/11) 21 tablet 0   No facility-administered medications prior to visit.    Allergies  Allergen Reactions   Codeine Nausea Only   Keflex [Cephalexin] Rash    Review of Systems  Constitutional:  Negative for fever.  Eyes:  Positive for redness. Negative for blurred vision, double vision, photophobia and discharge.  Gastrointestinal:  Negative for nausea.  All other systems reviewed and are negative.     Objective:    Physical Exam Vitals and nursing note reviewed.  Constitutional:      Appearance: Normal appearance.  HENT:     Head: Normocephalic.     Nose: Nose normal.     Mouth/Throat:     Mouth: Mucous membranes are moist.     Pharynx: Oropharynx is clear.  Eyes:     General: Lids are normal. No scleral icterus.       Right eye: No foreign body, discharge  or hordeolum.        Left eye: No foreign body, discharge or hordeolum.     Conjunctiva/sclera:     Left eye: Left conjunctiva is injected.  Cardiovascular:     Rate and Rhythm: Normal rate and regular rhythm.     Pulses: Normal pulses.     Heart sounds: Normal heart sounds.  Abdominal:     General: Bowel sounds are normal.  Neurological:     Mental Status: He is alert.    BP 138/88   Pulse 71   Temp 97.6 F (36.4 C) (Temporal)   Ht '5\' 8"'$  (1.727 m)   Wt 169 lb (76.7 kg)   SpO2 97%   BMI 25.70 kg/m  Wt Readings from Last 3 Encounters:  11/14/20 169 lb (76.7 kg)  05/09/19 180 lb (81.6 kg)  04/29/18 182 lb 3.2 oz (82.6 kg)    Health Maintenance Due  Topic Date Due   COVID-19 Vaccine (1) Never done   Hepatitis C Screening  Never done   COLONOSCOPY (Pts 45-72yr Insurance coverage will need to be confirmed)  Never done    Zoster Vaccines- Shingrix (1 of 2) Never done   INFLUENZA VACCINE  Never done   TETANUS/TDAP  10/28/2020    There are no preventive care reminders to display for this patient.   No results found for: TSH Lab Results  Component Value Date   WBC 8.0 05/21/2014   HGB 14.6 05/21/2014   HCT 42.7 05/21/2014   MCV 92 05/21/2014   PLT 263 05/21/2014   Lab Results  Component Value Date   NA 141 05/21/2014   K 4.3 05/21/2014   CO2 26 05/21/2014   GLUCOSE 102 (H) 05/21/2014   BUN 17 05/21/2014   CREATININE 0.91 05/21/2014   BILITOT 0.2 05/21/2014   ALKPHOS 84 05/21/2014   AST 31 05/21/2014   ALT 47 (H) 05/21/2014   PROT 7.6 05/21/2014   ALBUMIN 4.7 05/21/2014   CALCIUM 9.8 05/21/2014        Assessment & Plan:   Problem List Items Addressed This Visit       Other   Subconjunctival hemorrhage of right eye - Primary    Injected sclera in the last 24 hours.  No sensation of object in the eye, no blurry vision, no fever, headache or impaired vision.  No injected sclera looks like a soft conjunctival hemorrhage.  Advised patient to walk in eye clinic for follow-up. Tylenol for pain, cool clean compress, follow-up with worsening unresolved symptoms.        No orders of the defined types were placed in this encounter.    OIvy Lynn NP

## 2021-02-23 DIAGNOSIS — M549 Dorsalgia, unspecified: Secondary | ICD-10-CM | POA: Diagnosis not present

## 2021-02-23 DIAGNOSIS — R519 Headache, unspecified: Secondary | ICD-10-CM | POA: Diagnosis not present

## 2021-02-23 DIAGNOSIS — R1110 Vomiting, unspecified: Secondary | ICD-10-CM | POA: Diagnosis not present

## 2021-02-23 DIAGNOSIS — Z885 Allergy status to narcotic agent status: Secondary | ICD-10-CM | POA: Diagnosis not present

## 2021-02-23 DIAGNOSIS — R7989 Other specified abnormal findings of blood chemistry: Secondary | ICD-10-CM | POA: Diagnosis not present

## 2021-02-23 DIAGNOSIS — I517 Cardiomegaly: Secondary | ICD-10-CM | POA: Diagnosis not present

## 2021-02-23 DIAGNOSIS — Z20822 Contact with and (suspected) exposure to covid-19: Secondary | ICD-10-CM | POA: Diagnosis not present

## 2021-02-23 DIAGNOSIS — R112 Nausea with vomiting, unspecified: Secondary | ICD-10-CM | POA: Diagnosis not present

## 2021-02-24 DIAGNOSIS — M549 Dorsalgia, unspecified: Secondary | ICD-10-CM | POA: Diagnosis not present

## 2021-02-24 DIAGNOSIS — M545 Low back pain, unspecified: Secondary | ICD-10-CM | POA: Diagnosis not present

## 2021-02-24 DIAGNOSIS — N132 Hydronephrosis with renal and ureteral calculous obstruction: Secondary | ICD-10-CM | POA: Diagnosis not present

## 2021-02-24 DIAGNOSIS — R3129 Other microscopic hematuria: Secondary | ICD-10-CM | POA: Diagnosis not present

## 2021-02-24 DIAGNOSIS — K5730 Diverticulosis of large intestine without perforation or abscess without bleeding: Secondary | ICD-10-CM | POA: Diagnosis not present

## 2021-02-24 DIAGNOSIS — N200 Calculus of kidney: Secondary | ICD-10-CM | POA: Diagnosis not present

## 2021-02-24 DIAGNOSIS — I700 Atherosclerosis of aorta: Secondary | ICD-10-CM | POA: Diagnosis not present

## 2021-02-27 DIAGNOSIS — N202 Calculus of kidney with calculus of ureter: Secondary | ICD-10-CM | POA: Diagnosis not present

## 2021-03-04 ENCOUNTER — Encounter (HOSPITAL_BASED_OUTPATIENT_CLINIC_OR_DEPARTMENT_OTHER): Payer: Self-pay | Admitting: Urology

## 2021-03-04 ENCOUNTER — Other Ambulatory Visit: Payer: Self-pay | Admitting: Urology

## 2021-03-04 DIAGNOSIS — N202 Calculus of kidney with calculus of ureter: Secondary | ICD-10-CM | POA: Diagnosis not present

## 2021-03-04 NOTE — Progress Notes (Signed)
Spoke w/ via phone for pre-op interview---unable to reach pt, left message on voicemail Lab needs dos----none               Lab results------02/24/21 CBC & CMP in Epic COVID test -----patient states asymptomatic no test needed? Arrive at -------0800 on 03/05/21 NPO after MN NO Solid Food.  Water from MN until---0700 Med rec completed Medications to take morning of surgery -----Omeprazole Diabetic medication -----n/a Patient instructed no nail polish to be worn day of surgery Patient instructed to bring photo id and insurance card day of surgery Patient aware to have Driver (ride ) / caregiver    for 24 hours after surgery - driver/caregiver needs to be over 34 years of age  Patient Special Instructions -----Bring list of meds. Pre-Op special Istructions -----none Patient verbalized understanding of instructions that were given at this phone interview. Patient denies shortness of breath, chest pain, fever, cough at this phone interview.

## 2021-03-04 NOTE — Progress Notes (Signed)
Patient was added on for surgery tomorrow 03/05/21. Multiple unsuccessful attempts were made to reach the patient. I was unable to do pre-op phone call and update history. I left a message for the patient instructing him to be NPO after MN except water until 7 am, take Omeprazole before 7 am, arrive at [facility] at 8 am, bring driver/caregiver over 62 years of age for 24 hours after surgery, and bring list of medications.

## 2021-03-04 NOTE — H&P (Signed)
H&P  Chief Complaint: Rt sided kidney stone  History of Present Illness: 61 yo male originally presented 8 days ago w/ Rt mid ureteral stone. Came back yesterday w/ continued painful sx's--requested mgmt d/t pain. He presents now for Rt URS/HLL/stone mgmt.  Past Medical History:  Diagnosis Date   Laryngopharyngeal reflux    Migraines     Past Surgical History:  Procedure Laterality Date   HERNIA REPAIR     Three times   KNEE SURGERY Right    LEG SURGERY Left     Home Medications:  Allergies as of 03/04/2021       Reactions   Codeine Nausea Only   Keflex [cephalexin] Rash        Medication List      Notice   Cannot display discharge medications because the patient has not yet been admitted.     Allergies:  Allergies  Allergen Reactions   Codeine Nausea Only   Keflex [Cephalexin] Rash    Family History  Problem Relation Age of Onset   Dementia Mother    Leukemia Father    Cancer Sister        Colon   Diabetes Brother     Social History:  reports that he has never smoked. He has never used smokeless tobacco. He reports current alcohol use. He reports that he does not use drugs.  ROS: A complete review of systems was performed.  All systems are negative except for pertinent findings as noted.  Physical Exam:  Vital signs in last 24 hours: There were no vitals taken for this visit. Constitutional:  Alert and oriented, No acute distress Cardiovascular: Regular rate  Respiratory: Normal respiratory effort GI: Abdomen is soft, nontender, nondistended, no abdominal masses. No CVAT.  Genitourinary: Normal male phallus, testes are descended bilaterally and non-tender and without masses, scrotum is normal in appearance without lesions or masses, perineum is normal on inspection. Lymphatic: No lymphadenopathy Neurologic: Grossly intact, no focal deficits Psychiatric: Normal mood and affect  I have reviewed prior pt notes  I have reviewed urinalysis  results  I have independently reviewed prior imaging    Impression/Assessment:  Rt distal ureteral stone  Plan:  Cysto, Rt RGP, URS, HLL/stone extraction/stent

## 2021-03-05 ENCOUNTER — Ambulatory Visit (HOSPITAL_BASED_OUTPATIENT_CLINIC_OR_DEPARTMENT_OTHER)
Admission: RE | Admit: 2021-03-05 | Discharge: 2021-03-05 | Disposition: A | Discharge status: Home / Self Care | Payer: BC Managed Care – PPO | Attending: Urology | Admitting: Urology

## 2021-03-05 ENCOUNTER — Ambulatory Visit (HOSPITAL_BASED_OUTPATIENT_CLINIC_OR_DEPARTMENT_OTHER): Payer: BC Managed Care – PPO | Admitting: Anesthesiology

## 2021-03-05 ENCOUNTER — Other Ambulatory Visit: Payer: Self-pay

## 2021-03-05 ENCOUNTER — Encounter (HOSPITAL_BASED_OUTPATIENT_CLINIC_OR_DEPARTMENT_OTHER): Payer: Self-pay | Admitting: Urology

## 2021-03-05 ENCOUNTER — Encounter (HOSPITAL_BASED_OUTPATIENT_CLINIC_OR_DEPARTMENT_OTHER)
Admission: RE | Disposition: A | Discharge status: Home / Self Care | Payer: Self-pay | Source: Home / Self Care | Attending: Urology

## 2021-03-05 DIAGNOSIS — N202 Calculus of kidney with calculus of ureter: Secondary | ICD-10-CM | POA: Insufficient documentation

## 2021-03-05 DIAGNOSIS — N201 Calculus of ureter: Secondary | ICD-10-CM | POA: Diagnosis not present

## 2021-03-05 HISTORY — DX: Gastro-esophageal reflux disease without esophagitis: K21.9

## 2021-03-05 HISTORY — PX: CYSTOSCOPY WITH RETROGRADE PYELOGRAM, URETEROSCOPY AND STENT PLACEMENT: SHX5789

## 2021-03-05 HISTORY — DX: Personal history of urinary calculi: Z87.442

## 2021-03-05 SURGERY — CYSTOURETEROSCOPY, WITH RETROGRADE PYELOGRAM AND STENT INSERTION
Anesthesia: General | Site: Ureter | Laterality: Right

## 2021-03-05 MED ORDER — ACETAMINOPHEN 500 MG PO TABS
ORAL_TABLET | ORAL | Status: AC
  Filled 2021-03-05: qty 2

## 2021-03-05 MED ORDER — FENTANYL CITRATE (PF) 100 MCG/2ML IJ SOLN
INTRAMUSCULAR | Status: AC
  Filled 2021-03-05: qty 2

## 2021-03-05 MED ORDER — KETOROLAC TROMETHAMINE 30 MG/ML IJ SOLN
INTRAMUSCULAR | Status: DC | PRN
  Administered 2021-03-05: 30 mg via INTRAVENOUS

## 2021-03-05 MED ORDER — ACETAMINOPHEN 500 MG PO TABS
1000.0000 mg | ORAL_TABLET | Freq: Once | ORAL | Status: AC
  Administered 2021-03-05: 1000 mg via ORAL

## 2021-03-05 MED ORDER — MIDAZOLAM HCL 5 MG/5ML IJ SOLN
INTRAMUSCULAR | Status: DC | PRN
  Administered 2021-03-05: 2 mg via INTRAVENOUS

## 2021-03-05 MED ORDER — CIPROFLOXACIN IN D5W 400 MG/200ML IV SOLN
400.0000 mg | INTRAVENOUS | Status: AC
  Administered 2021-03-05: 400 mg via INTRAVENOUS

## 2021-03-05 MED ORDER — DEXAMETHASONE SODIUM PHOSPHATE 10 MG/ML IJ SOLN
INTRAMUSCULAR | Status: AC
  Filled 2021-03-05: qty 1

## 2021-03-05 MED ORDER — ONDANSETRON HCL 4 MG/2ML IJ SOLN
INTRAMUSCULAR | Status: DC | PRN
  Administered 2021-03-05: 4 mg via INTRAVENOUS

## 2021-03-05 MED ORDER — CIPROFLOXACIN IN D5W 400 MG/200ML IV SOLN
INTRAVENOUS | Status: AC
  Filled 2021-03-05: qty 200

## 2021-03-05 MED ORDER — PROPOFOL 10 MG/ML IV BOLUS
INTRAVENOUS | Status: DC | PRN
Start: 2021-03-05 — End: 2021-03-05
  Administered 2021-03-05: 150 mg via INTRAVENOUS
  Administered 2021-03-05: 50 mg via INTRAVENOUS

## 2021-03-05 MED ORDER — SODIUM CHLORIDE (PF) 0.9 % IJ SOLN
INTRAMUSCULAR | Status: DC | PRN
  Administered 2021-03-05: 10 mL

## 2021-03-05 MED ORDER — LACTATED RINGERS IV SOLN: INTRAVENOUS | Status: DC

## 2021-03-05 MED ORDER — KETOROLAC TROMETHAMINE 30 MG/ML IJ SOLN
INTRAMUSCULAR | Status: AC
  Filled 2021-03-05: qty 1

## 2021-03-05 MED ORDER — LIDOCAINE 2% (20 MG/ML) 5 ML SYRINGE
INTRAMUSCULAR | Status: DC | PRN
  Administered 2021-03-05: 90 mg via INTRAVENOUS

## 2021-03-05 MED ORDER — ONDANSETRON HCL 4 MG/2ML IJ SOLN: 4.0000 mg | Freq: Once | INTRAMUSCULAR | Status: DC | PRN

## 2021-03-05 MED ORDER — DROPERIDOL 2.5 MG/ML IJ SOLN: 0.6250 mg | Freq: Once | INTRAMUSCULAR | Status: DC | PRN

## 2021-03-05 MED ORDER — MIDAZOLAM HCL 2 MG/2ML IJ SOLN
INTRAMUSCULAR | Status: AC
  Filled 2021-03-05: qty 2

## 2021-03-05 MED ORDER — DEXAMETHASONE SODIUM PHOSPHATE 10 MG/ML IJ SOLN
INTRAMUSCULAR | Status: DC | PRN
  Administered 2021-03-05: 10 mg via INTRAVENOUS

## 2021-03-05 MED ORDER — LIDOCAINE 2% (20 MG/ML) 5 ML SYRINGE
INTRAMUSCULAR | Status: AC
  Filled 2021-03-05: qty 5

## 2021-03-05 MED ORDER — FENTANYL CITRATE (PF) 100 MCG/2ML IJ SOLN
INTRAMUSCULAR | Status: DC | PRN
Start: 2021-03-05 — End: 2021-03-05
  Administered 2021-03-05: 50 ug via INTRAVENOUS

## 2021-03-05 MED ORDER — FENTANYL CITRATE (PF) 100 MCG/2ML IJ SOLN: 25.0000 ug | INTRAMUSCULAR | Status: DC | PRN

## 2021-03-05 MED ORDER — SODIUM CHLORIDE 0.9 % IR SOLN
Status: DC | PRN
  Administered 2021-03-05: 3000 mL via INTRAVESICAL

## 2021-03-05 MED ORDER — ONDANSETRON HCL 4 MG/2ML IJ SOLN
INTRAMUSCULAR | Status: AC
  Filled 2021-03-05: qty 2

## 2021-03-05 MED ORDER — SULFAMETHOXAZOLE-TRIMETHOPRIM 800-160 MG PO TABS: 1.0000 | ORAL_TABLET | Freq: Two times a day (BID) | ORAL | 0 refills | Status: DC

## 2021-03-05 SURGICAL SUPPLY — 26 items
BAG DRAIN URO-CYSTO SKYTR STRL (DRAIN) ×3 IMPLANT
BAG DRN UROCATH (DRAIN) ×1
BASKET ZERO TIP NITINOL 2.4FR (BASKET) ×1 IMPLANT
BSKT STON RTRVL ZERO TP 2.4FR (BASKET) ×1
CATH INTERMIT  6FR 70CM (CATHETERS) ×3 IMPLANT
CLOTH BEACON ORANGE TIMEOUT ST (SAFETY) ×3 IMPLANT
COVER DOME SNAP 22 D (MISCELLANEOUS) ×3 IMPLANT
DRSG TEGADERM 2-3/8X2-3/4 SM (GAUZE/BANDAGES/DRESSINGS) ×1 IMPLANT
ELECT REM PT RETURN 9FT ADLT (ELECTROSURGICAL)
ELECTRODE REM PT RTRN 9FT ADLT (ELECTROSURGICAL) IMPLANT
FIBER LASER FLEXIVA 365 (UROLOGICAL SUPPLIES) IMPLANT
GLOVE SURG ENC MOIS LTX SZ8 (GLOVE) ×3 IMPLANT
GOWN STRL REUS W/TWL LRG LVL3 (GOWN DISPOSABLE) ×3 IMPLANT
GOWN STRL REUS W/TWL XL LVL3 (GOWN DISPOSABLE) ×1 IMPLANT
GUIDEWIRE ANG ZIPWIRE 038X150 (WIRE) ×1 IMPLANT
GUIDEWIRE STR DUAL SENSOR (WIRE) ×1 IMPLANT
IV NS IRRIG 3000ML ARTHROMATIC (IV SOLUTION) ×5 IMPLANT
KIT TURNOVER CYSTO (KITS) ×3 IMPLANT
MANIFOLD NEPTUNE II (INSTRUMENTS) ×3 IMPLANT
NS IRRIG 500ML POUR BTL (IV SOLUTION) ×3 IMPLANT
PACK CYSTO (CUSTOM PROCEDURE TRAY) ×3 IMPLANT
SHEATH URETERAL 12FRX35CM (MISCELLANEOUS) ×1 IMPLANT
TRACTIP FLEXIVA PULS ID 200XHI (Laser) IMPLANT
TRACTIP FLEXIVA PULSE ID 200 (Laser)
TUBE CONNECTING 12X1/4 (SUCTIONS) ×1 IMPLANT
TUBING UROLOGY SET (TUBING) ×1 IMPLANT

## 2021-03-05 NOTE — Anesthesia Procedure Notes (Signed)
Procedure Name: LMA Insertion Date/Time: 03/05/2021 10:47 AM Performed by: Bonney Aid, CRNA Pre-anesthesia Checklist: Patient identified, Emergency Drugs available, Suction available and Patient being monitored Patient Re-evaluated:Patient Re-evaluated prior to induction Oxygen Delivery Method: Circle system utilized Preoxygenation: Pre-oxygenation with 100% oxygen Induction Type: IV induction Ventilation: Mask ventilation without difficulty LMA: LMA inserted LMA Size: 5.0 Number of attempts: 1 Airway Equipment and Method: Bite block Placement Confirmation: positive ETCO2 Tube secured with: Tape Dental Injury: Teeth and Oropharynx as per pre-operative assessment

## 2021-03-05 NOTE — Interval H&P Note (Signed)
History and Physical Interval Note:  03/05/2021 9:16 AM  Keith Shields  has presented today for surgery, with the diagnosis of RIGHT URETERAL STONE.  The various methods of treatment have been discussed with the patient and family. After consideration of risks, benefits and other options for treatment, the patient has consented to  Procedure(s): CYSTOSCOPY WITH RIGHT RETROGRADE PYELOGRAM, URETEROSCOPY [name] (Right) as a surgical intervention.  The patient's history has been reviewed, patient examined, no change in status, stable for surgery.  I have reviewed the patient's chart and labs.  Questions were answered to the patient's satisfaction.     Lillette Boxer Trenita Hulme

## 2021-03-05 NOTE — Anesthesia Preprocedure Evaluation (Addendum)
Anesthesia Evaluation  Patient identified by MRN, date of birth, ID band Patient awake    Reviewed: Allergy & Precautions, NPO status , Patient's Chart, lab work & pertinent test results  Airway Mallampati: I  TM Distance: >3 FB Neck ROM: Full    Dental no notable dental hx.    Pulmonary neg pulmonary ROS,    Pulmonary exam normal breath sounds clear to auscultation       Cardiovascular Exercise Tolerance: Good negative cardio ROS Normal cardiovascular exam Rhythm:Regular Rate:Normal     Neuro/Psych  Headaches, negative psych ROS   GI/Hepatic Neg liver ROS, GERD  ,  Endo/Other  negative endocrine ROS  Renal/GU negative Renal ROS  negative genitourinary   Musculoskeletal negative musculoskeletal ROS (+)   Abdominal   Peds negative pediatric ROS (+)  Hematology negative hematology ROS (+)   Anesthesia Other Findings   Reproductive/Obstetrics negative OB ROS                            Anesthesia Physical Anesthesia Plan  ASA: 3  Anesthesia Plan: General   Post-op Pain Management:    Induction: Intravenous  PONV Risk Score and Plan: 2 and Treatment may vary due to age or medical condition, Ondansetron and Dexamethasone  Airway Management Planned: LMA  Additional Equipment: None  Intra-op Plan:   Post-operative Plan: Extubation in OR  Informed Consent: I have reviewed the patients History and Physical, chart, labs and discussed the procedure including the risks, benefits and alternatives for the proposed anesthesia with the patient or authorized representative who has indicated his/her understanding and acceptance.     Dental advisory given  Plan Discussed with: Anesthesiologist and CRNA  Anesthesia Plan Comments:        Anesthesia Quick Evaluation

## 2021-03-05 NOTE — Addendum Note (Signed)
Addendum  created 03/05/21 1443 by Rogers Blocker, CRNA   Charge Capture section accepted

## 2021-03-05 NOTE — Op Note (Signed)
Preoperative diagnosis: Right distal ureteral and renal calculi  Postoperative diagnosis: Same  Principal procedure: Cystoscopy, right retrograde ureteropyelogram, fluoroscopic interpretation, right ureteroscopy, extraction of right distal ureteral and renal calculi, placement of 6 French by 26 cm contour double-J stent with tether  Surgeon: Ernie Kasler  Anesthesia: General with LMA  Complications: None  Estimated blood loss: None  Specimen: Stones  Drains: None  Indications: 61 year old male with persistently symptomatic 3 x 6 mm right distal ureteral stone.  Seen in follow-up yesterday.  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  I discussed ureteroscopy and lithotripsy with him.  As he does have a right ureteral and renal stone, I felt it best to perform ureteroscopic management of these.  He is familiar with ureteroscopy, having been through the procedure before.  He understands the risk, complications and desires to proceed.  Findings, urethra was normal, prostate nonobstructive.  Bladder displayed normal urothelium with normal ureteral orifices.  Retrograde study of the right ureter and pyelocalyceal system revealed a filling defect at the U UVJ, no other filling defects or abnormalities were seen.  There is no hydronephrosis.  Description of procedure: The patient was properly identified and marked in the holding area, then taken to the operating room where general anesthetic was administered with the LMA.  He is placed in the dorsolithotomy position.  Genitalia and perineum were prepped, draped, proper timeout performed.  21 French panendoscope placed into the bladder with the above-mentioned findings noted.  Using a 6 [place] open-ended catheter retrograde study of the right ureter was performed using Omnipaque with the above-mentioned findings.  I then negotiated a sensor tip guidewire through the open-ended catheter, and the catheter and cystoscope  were removed.  Ureter at the orifice was sequentially dilated first with the obturator and then the entire 11/13 ureteral access catheter.  This was then removed over the guidewire and the 5 French ureteroscope was advanced through the urethra into the ureter and the stone grasped and extracted with a nitinol basket.  The rigid ureteroscope was then removed.  With the guidewire in place, the ureteral access catheter was then again placed into the proximal ureter.  Obturator was removed and then I negotiated the dual-lumen flexible ureteroscope into the renal pelvis.  Pelvis and calyceal system was then sequentially inspected.  There was an interpolar/lower pole stone in the lateral calyx that was noted.  There were Randall's plaques on the other papillae.  This is the only stone seen it was then grasped, extracted through the access catheter.  There being no other stone matter in the right side, the ureteroscope was left out.  The guidewire was negotiated back through the access catheter which was then removed.  Guidewire then backloaded through the scope and a 6 [place] by 26 cm contour double-J stent with tether remaining was negotiated into the right ureter.  It was then deployed after the guidewire was removed with excellent proximal and distal curl seen with fluoroscopy and cystoscopy, respectively.  Bladder was drained, scope removed.  The tether was tied in a knot right at the urethral meatus, it was then trimmed and taped to the patient's penis.  At this point the procedure was terminated.  The patient was awakened, taken to the PACU in stable condition having tolerated procedure well.

## 2021-03-05 NOTE — Discharge Instructions (Addendum)
You may see some blood in the urine and may have some burning with urination for 48-72 hours. You also may notice that you have to urinate more frequently or urgently after your procedure which is normal.  You should call should you develop an inability urinate, fever > 101, persistent nausea and vomiting that prevents you from eating or drinking to stay hydrated.  If you have a stent, you will likely urinate more frequently and urgently until the stent is removed and you may experience some discomfort/pain in the lower abdomen and flank especially when urinating. You may take pain medication prescribed to you if needed for pain. You may also intermittently have blood in the urine until the stent is removed.  We will call you to set up a visit Monday to remove your stent [facility] [phone] Post Ureteroscopy With or Without Stent Instructions  Definitions:  Ureter: The duct that transports urine from the kidney to the bladder. Stent:   A plastic hollow tube that is placed into the ureter, from the kidney to the bladder to prevent the ureter from swelling shut.  GENERAL INSTRUCTIONS:  Despite the fact that no skin incisions were used, the area around the ureter and bladder is raw and irritated. The stent is a foreign body which will further irritate the bladder wall. This irritation is manifested by increased frequency of urination, both day and night, and by an increase in the urge to urinate. In some, the urge to urinate is present almost always. Sometimes the urge is strong enough that you may not be able to stop yourself from urinating. The only real cure is to remove the stent and then give time for the bladder wall to heal which can't be done until the danger of the ureter swelling shut has passed, which varies.  You may see some blood in your urine while the stent is in place and a few days afterwards. Do not be alarmed, even if the urine was clear for a while. Get off  your feet and drink lots of fluids until clearing occurs. If you start to pass clots or don't improve, call us.  DIET: You may return to your normal diet immediately. Because of the raw surface of your bladder, alcohol, spicy foods, acid type foods and drinks with caffeine may cause irritation or frequency and should be used in moderation. To keep your urine flowing freely and to avoid constipation, drink plenty of fluids during the day ( 8-10 glasses ). Tip: Avoid cranberry juice because it is very acidic.  ACTIVITY: Your physical activity doesn't need to be restricted. However, if you are very active, you may see some blood in your urine. We suggest that you reduce your activity under these circumstances until the bleeding has stopped.  BOWELS: It is important to keep your bowels regular during the postoperative period. Straining with bowel movements can cause bleeding. A bowel movement every other day is reasonable. Use a mild laxative if needed, such as Milk of Magnesia 2-3 tablespoons, or 2 Dulcolax tablets. Call if you continue to have problems. If you have been taking narcotics for pain, before, during or after your surgery, you may be constipated. Take a laxative if necessary.   MEDICATION: You should resume your pre-surgery medications unless told not to. In addition you will often be given an antibiotic to prevent infection. These should be taken as prescribed until the bottles are finished unless you are having an unusual reaction to one of the  drugs.  PROBLEMS YOU SHOULD REPORT TO [place]: Fevers over 100.5 Fahrenheit. Heavy bleeding, or clots ( See above notes about blood in urine ). Inability to urinate. Drug reactions ( hives, rash, nausea, vomiting, diarrhea ). Severe burning or pain with urination that is not improving.  FOLLOW-UP: You will need a follow-up appointment to monitor your progress. Call for this appointment at the number listed above. Usually the first appointment  will be about three to fourteen days after your surgery.   Post Anesthesia Home Care Instructions  Activity: Get plenty of rest for the remainder of the day. A responsible individual must stay with you for 24 hours following the procedure.  For the next 24 hours, DO NOT: -Drive a car -Paediatric nurse -Drink alcoholic beverages -Take any medication unless instructed by your physician -Make any legal decisions or sign important papers.  Meals: Start with liquid foods such as gelatin or soup. Progress to regular foods as tolerated. Avoid greasy, spicy, heavy foods. If nausea and/or vomiting occur, drink only clear liquids until the nausea and/or vomiting subsides. Call your physician if vomiting continues.  Special Instructions/Symptoms: Your throat may feel dry or sore from the anesthesia or the breathing tube placed in your throat during surgery. If this causes discomfort, gargle with warm salt water. The discomfort should disappear within 24 hours.  No acetaminophen/Tylenol until after 2:30 pm today if needed. No ibuprofen, Advil, Aleve, Motrin, ketorolac, meloxicam, naproxen, or other NSAIDS until after 5:00 pm today if needed.

## 2021-03-05 NOTE — Transfer of Care (Signed)
Immediate Anesthesia Transfer of Care Note  Patient: Keith Shields  Procedure(s) Performed: CYSTOSCOPY WITH RIGHT RETROGRADE PYELOGRAM, URETEROSCOPY AND STENT PLACEMENT (Right: Ureter)  Patient Location: PACU  Anesthesia Type:General  Level of Consciousness: drowsy and responds to stimulation  Airway & Oxygen Therapy: Patient Spontanous Breathing and Patient connected to face mask oxygen  Post-op Assessment: Report given to RN and Post -op Vital signs reviewed and stable  Post vital signs: Reviewed and stable  Last Vitals:  Vitals Value Taken Time  BP 95/65 03/05/21 1128  Temp 36.7 C 03/05/21 1129  Pulse 65 03/05/21 1130  Resp 10 03/05/21 1130  SpO2 99 % 03/05/21 1130  Vitals shown include unvalidated device data.  Last Pain:  Vitals:   03/05/21 0803  TempSrc: Oral  PainSc: 5       Patients Stated Pain Goal: 5 (73/42/87 6811)  Complications: No notable events documented.

## 2021-03-05 NOTE — Anesthesia Postprocedure Evaluation (Signed)
Anesthesia Post Note  Patient: Keith Shields  Procedure(s) Performed: CYSTOSCOPY WITH RIGHT RETROGRADE PYELOGRAM, URETEROSCOPY AND STENT PLACEMENT (Right: Ureter)     Patient location during evaluation: PACU Anesthesia Type: General Level of consciousness: sedated and awake Pain management: pain level controlled Vital Signs Assessment: post-procedure vital signs reviewed and stable Respiratory status: spontaneous breathing and respiratory function stable Cardiovascular status: stable Postop Assessment: no apparent nausea or vomiting Anesthetic complications: no   No notable events documented.  Last Vitals:  Vitals:   03/05/21 1200 03/05/21 1230  BP: 126/85 (!) 143/88  Pulse: 85 93  Resp: 13 16  Temp: (!) 36.4 C 36.4 C  SpO2: 97% 100%    Last Pain:  Vitals:   03/05/21 1230  TempSrc:   PainSc: 5                  Candra R Glennda Weatherholtz

## 2021-03-06 ENCOUNTER — Encounter (HOSPITAL_BASED_OUTPATIENT_CLINIC_OR_DEPARTMENT_OTHER): Payer: Self-pay | Admitting: Urology

## 2021-03-26 DIAGNOSIS — N202 Calculus of kidney with calculus of ureter: Secondary | ICD-10-CM | POA: Diagnosis not present

## 2021-04-08 ENCOUNTER — Ambulatory Visit: Payer: BC Managed Care – PPO | Admitting: Family Medicine

## 2021-04-08 ENCOUNTER — Encounter: Payer: Self-pay | Admitting: Family Medicine

## 2021-04-08 VITALS — BP 126/77 | HR 88 | Ht 67.0 in | Wt 170.0 lb

## 2021-04-08 DIAGNOSIS — Z1211 Encounter for screening for malignant neoplasm of colon: Secondary | ICD-10-CM

## 2021-04-08 DIAGNOSIS — N529 Male erectile dysfunction, unspecified: Secondary | ICD-10-CM

## 2021-04-08 MED ORDER — SILDENAFIL CITRATE 20 MG PO TABS
20.0000 mg | ORAL_TABLET | ORAL | 1 refills | Status: AC | PRN
Start: 2021-04-08 — End: ?

## 2021-04-08 NOTE — Progress Notes (Signed)
BP 126/77    Pulse 88    Ht 5\' 7"  (1.702 m)    Wt 170 lb (77.1 kg)    SpO2 96%    BMI 26.63 kg/m    Subjective:   Patient ID: Keith Shields, male    DOB: 12-Mar-1960, 61 y.o.   MRN: [id number]  HPI: Keith Shields is a 61 y.o. male presenting on 04/08/2021 for Erectile Dysfunction   HPI ED Patient is coming in for ED.  He has difficulty getting and maintaining and finishing erection. He denies urinary frequency or burning or pain.  He is just upset that he can't have sex with his wife.  He says this is been going on for couple years now.  He just got to the point where he would like to try something.    Relevant past medical, surgical, family and social history reviewed and updated as indicated. Interim medical history since our last visit reviewed. Allergies and medications reviewed and updated.  Review of Systems  Constitutional:  Negative for chills and fever.  Respiratory:  Negative for shortness of breath and wheezing.   Cardiovascular:  Negative for chest pain and leg swelling.  Gastrointestinal:  Negative for abdominal pain.  Genitourinary:  Negative for decreased urine volume, difficulty urinating, dysuria, frequency, hematuria and urgency.  Musculoskeletal:  Negative for back pain and gait problem.  Skin:  Negative for rash.  Neurological:  Negative for dizziness and light-headedness.  All other systems reviewed and are negative.  Per HPI unless specifically indicated above   Allergies as of 04/08/2021       Reactions   Codeine Nausea Only   Keflex [cephalexin] Rash        Medication List        Accurate as of April 08, 2021  4:11 PM. If you have any questions, ask your nurse or doctor.          STOP taking these medications    ketorolac 10 MG tablet Commonly known as: TORADOL Stopped by: Fransisca Kaufmann Savier Trickett, MD   sulfamethoxazole-trimethoprim 800-160 MG tablet Commonly known as: BACTRIM DS Stopped by: Fransisca Kaufmann Vearl Aitken, MD   tamsulosin 0.4 MG  Caps capsule Commonly known as: FLOMAX Stopped by: Fransisca Kaufmann Arjuna Doeden, MD       TAKE these medications    omeprazole 20 MG capsule Commonly known as: PRILOSEC Take 20 mg by mouth 2 (two) times daily before a meal.   sildenafil 20 MG tablet Commonly known as: REVATIO Take 1-5 tablets (20-100 mg total) by mouth as needed. Started by: Fransisca Kaufmann Emeril Stille, MD         Objective:   BP 126/77    Pulse 88    Ht 5\' 7"  (1.702 m)    Wt 170 lb (77.1 kg)    SpO2 96%    BMI 26.63 kg/m   Wt Readings from Last 3 Encounters:  04/08/21 170 lb (77.1 kg)  03/05/21 166 lb 1.6 oz (75.3 kg)  11/14/20 169 lb (76.7 kg)    Physical Exam Vitals and nursing note reviewed.  Constitutional:      Appearance: Normal appearance.  Skin:    Findings: No rash.  Neurological:     General: No focal deficit present.     Mental Status: He is alert and oriented to person, place, and time.      Assessment & Plan:   Problem List Items Addressed This Visit   None Visit Diagnoses     Colon  cancer screening    -  Primary   Relevant Orders   Ambulatory referral to Gastroenterology   Erectile dysfunction, unspecified erectile dysfunction type       Relevant Medications   sildenafil (REVATIO) 20 MG tablet       Will try sildenafil.  Follow up plan: Return if symptoms worsen or fail to improve.  Counseling provided for all of the vaccine components Orders Placed This Encounter  Procedures   Ambulatory referral to Gastroenterology    Caryl Pina, MD [facility] 04/08/2021, 4:11 PM

## 2021-04-13 ENCOUNTER — Encounter (INDEPENDENT_AMBULATORY_CARE_PROVIDER_SITE_OTHER): Payer: Self-pay | Admitting: *Deleted

## 2021-06-02 ENCOUNTER — Encounter (INDEPENDENT_AMBULATORY_CARE_PROVIDER_SITE_OTHER): Payer: Self-pay

## 2021-06-02 ENCOUNTER — Other Ambulatory Visit (INDEPENDENT_AMBULATORY_CARE_PROVIDER_SITE_OTHER): Payer: Self-pay

## 2021-06-02 ENCOUNTER — Telehealth (INDEPENDENT_AMBULATORY_CARE_PROVIDER_SITE_OTHER): Payer: Self-pay

## 2021-06-02 DIAGNOSIS — Z1211 Encounter for screening for malignant neoplasm of colon: Secondary | ICD-10-CM

## 2021-06-02 MED ORDER — PEG 3350-KCL-NA BICARB-NACL 420 G PO SOLR: 4000.0000 mL | ORAL | 0 refills | Status: DC

## 2021-06-02 NOTE — Telephone Encounter (Signed)
Ok to schedule.  Thanks,  Lanyla Costello Castaneda Mayorga, MD Gastroenterology and Hepatology [facility]  

## 2021-06-02 NOTE — Telephone Encounter (Signed)
Zakry Caso Ann Navaya Wiatrek, CMA  ?

## 2021-06-02 NOTE — Telephone Encounter (Signed)
Referring MD/PCP: Dettinger ? ?Procedure: Tcs ? ?Reason/Indication:  Screening ? ?Has patient had this procedure before?  yes ? If so, when, by whom and where?  10 yrs ago ? ?Is there a family history of colon cancer?  no ? Who?  What age when diagnosed?   ? ?Is patient diabetic? If yes, Type 1 or Type 2   no ?     ?Does patient have prosthetic heart valve or mechanical valve?  no ? ?Do you have a pacemaker/defibrillator?  no ? ?Has patient ever had endocarditis/atrial fibrillation? no ? ?Does patient use oxygen? no ? ?Has patient had joint replacement within last 12 months?  no ? ?Is patient constipated or do they take laxatives? no ? ?Does patient have a history of alcohol/drug use?  no ? ?Have you had a stroke/heart attack last 6 mths? no ? ?Do you take medicine for weight loss?  no ? ?For male patients,: have you had a hysterectomy N/A ?                     are you post menopausal N/A ?                     do you still have your menstrual cycle N/A ? ?Is patient on blood thinner such as Coumadin, Plavix and/or Aspirin? no ? ?Medications: omeprazole 40 mg bid  ? ?Allergies: Keflex, Codeine ? ?Medication Adjustment per Dr Jenetta Downer none ? ?Procedure date & time: 07/01/21 at 1245 ? ? ?

## 2021-07-01 ENCOUNTER — Ambulatory Visit (HOSPITAL_COMMUNITY)
Admission: RE | Admit: 2021-07-01 | Discharge: 2021-07-01 | Disposition: A | Discharge status: Home / Self Care | Payer: BC Managed Care – PPO | Attending: Gastroenterology | Admitting: Gastroenterology

## 2021-07-01 ENCOUNTER — Encounter (HOSPITAL_COMMUNITY): Payer: Self-pay | Admitting: Gastroenterology

## 2021-07-01 ENCOUNTER — Ambulatory Visit (HOSPITAL_COMMUNITY): Payer: BC Managed Care – PPO | Admitting: Anesthesiology

## 2021-07-01 ENCOUNTER — Encounter (HOSPITAL_COMMUNITY)
Admission: RE | Disposition: A | Discharge status: Home / Self Care | Payer: Self-pay | Source: Home / Self Care | Attending: Gastroenterology

## 2021-07-01 ENCOUNTER — Other Ambulatory Visit: Payer: Self-pay

## 2021-07-01 ENCOUNTER — Encounter (INDEPENDENT_AMBULATORY_CARE_PROVIDER_SITE_OTHER): Payer: Self-pay | Admitting: *Deleted

## 2021-07-01 DIAGNOSIS — K635 Polyp of colon: Secondary | ICD-10-CM | POA: Insufficient documentation

## 2021-07-01 DIAGNOSIS — Z1211 Encounter for screening for malignant neoplasm of colon: Secondary | ICD-10-CM | POA: Diagnosis not present

## 2021-07-01 DIAGNOSIS — K219 Gastro-esophageal reflux disease without esophagitis: Secondary | ICD-10-CM | POA: Insufficient documentation

## 2021-07-01 DIAGNOSIS — K648 Other hemorrhoids: Secondary | ICD-10-CM | POA: Diagnosis not present

## 2021-07-01 DIAGNOSIS — D123 Benign neoplasm of transverse colon: Secondary | ICD-10-CM | POA: Diagnosis not present

## 2021-07-01 DIAGNOSIS — K5730 Diverticulosis of large intestine without perforation or abscess without bleeding: Secondary | ICD-10-CM

## 2021-07-01 HISTORY — PX: COLONOSCOPY WITH PROPOFOL: SHX5780

## 2021-07-01 HISTORY — PX: POLYPECTOMY: SHX5525

## 2021-07-01 LAB — HM COLONOSCOPY

## 2021-07-01 SURGERY — COLONOSCOPY WITH PROPOFOL
Anesthesia: General

## 2021-07-01 MED ORDER — PROPOFOL 10 MG/ML IV BOLUS
INTRAVENOUS | Status: DC | PRN
Start: 2021-07-01 — End: 2021-07-01
  Administered 2021-07-01: 80 mg via INTRAVENOUS

## 2021-07-01 MED ORDER — PROPOFOL 500 MG/50ML IV EMUL
INTRAVENOUS | Status: DC | PRN
  Administered 2021-07-01: 150 ug/kg/min via INTRAVENOUS

## 2021-07-01 MED ORDER — LACTATED RINGERS IV SOLN
INTRAVENOUS | Status: DC
  Administered 2021-07-01: 1000 mL via INTRAVENOUS

## 2021-07-01 NOTE — Op Note (Signed)
[facility] ?Patient Name: Keith Shields ?Procedure Date: 07/01/2021 12:04 PM ?MRN: [id number] ?Date of Birth: [birth year] ?Attending MD: Maylon Peppers ,  ?CSN: [id number] ?Age: 61 ?Admit Type: Outpatient ?Procedure:                Colonoscopy ?Indications:              Screening for colorectal malignant neoplasm ?Providers:                Maylon Peppers, Lambert Mody, Kristine L.  ?                          Risa Grill, Technician ?Referring MD:              ?Medicines:                Monitored Anesthesia Care ?Complications:            No immediate complications. ?Estimated Blood Loss:     Estimated blood loss: none. ?Procedure:                Pre-Anesthesia Assessment: ?                          - ASA Grade Assessment: II - A patient with mild  ?                          systemic disease. ?                          - Prior to the procedure, a History and Physical  ?                          was performed, and patient medications, allergies  ?                          and sensitivities were reviewed. The patient's  ?                          tolerance of previous anesthesia was reviewed. ?                          - The risks and benefits of the procedure and the  ?                          sedation options and risks were discussed with the  ?                          patient. All questions were answered and informed  ?                          consent was obtained. ?                          After obtaining informed consent, the colonoscope  ?                          was passed under direct vision. Throughout the  ?  procedure, the patient's blood pressure, pulse, and  ?                          oxygen saturations were monitored continuously. The  ?                          PCF-HQ190L ([id number]) scope was introduced through  ?                          the anus and advanced to the the cecum, identified  ?                          by appendiceal orifice and ileocecal valve. The  ?                           colonoscopy was performed without difficulty. The  ?                          patient tolerated the procedure well. The quality  ?                          of the bowel preparation was excellent. ?Scope In: 12:19:21 PM ?Scope Out: 12:43:02 PM ?Scope Withdrawal Time: 0 hours 20 minutes 12 seconds  ?Total Procedure Duration: 0 hours 23 minutes 41 seconds  ?Findings: ?     The perianal and digital rectal examinations were normal. ?     Two sessile polyps were found in the transverse colon. The polyps were 3  ?     to 4 mm in size. These polyps were removed with a cold snare. Resection  ?     and retrieval were complete. ?     Multiple small and large-mouthed diverticula were found in the sigmoid  ?     colon and descending colon. ?     Non-bleeding internal hemorrhoids were found during retroflexion. The  ?     hemorrhoids were small. ?Impression:               - Two 3 to 4 mm polyps in the transverse colon,  ?                          removed with a cold snare. Resected and retrieved. ?                          - Diverticulosis in the sigmoid colon and in the  ?                          descending colon. ?                          - Non-bleeding internal hemorrhoids. ?Moderate Sedation: ?     Per Anesthesia Care ?Recommendation:           - Discharge patient to home (ambulatory). ?                          - Resume previous diet. ?                          -  Await pathology results. ?                          - Repeat colonoscopy for surveillance based on  ?                          pathology results. ?Procedure Code(s):        --- Professional --- ?                          [phone], Colonoscopy, flexible; with removal of  ?                          tumor(s), polyp(s), or other lesion(s) by snare  ?                          technique ?Diagnosis Code(s):        --- Professional --- ?                          Z12.11, Encounter for screening for malignant  ?                          neoplasm of  colon ?                          K63.5, Polyp of colon ?                          K64.8, Other hemorrhoids ?                          K57.30, Diverticulosis of large intestine without  ?                          perforation or abscess without bleeding ?CPT copyright 2019 American Medical Association. All rights reserved. ?The codes documented in this report are preliminary and upon coder review may  ?be revised to meet current compliance requirements. ?Maylon Peppers, MD ?Maylon Peppers,  ?07/01/2021 12:51:00 PM ?This report has been signed electronically. ?Number of Addenda: 0 ?

## 2021-07-01 NOTE — Discharge Instructions (Signed)
You are being discharged to home.  Resume your previous diet.  We are waiting for your pathology results.  Your physician has recommended a repeat colonoscopy for surveillance based on pathology results.  

## 2021-07-01 NOTE — Anesthesia Preprocedure Evaluation (Signed)
Anesthesia Evaluation  ?Patient identified by MRN, date of birth, ID band ?Patient awake ? ? ? ?Reviewed: ?Allergy & Precautions, H&P , NPO status , Patient's Chart, lab work & pertinent test results, reviewed documented beta blocker date and time  ? ?Airway ?Mallampati: II ? ?TM Distance: >3 FB ?Neck ROM: full ? ? ? Dental ?no notable dental hx. ? ?  ?Pulmonary ?neg pulmonary ROS,  ?  ?Pulmonary exam normal ?breath sounds clear to auscultation ? ? ? ? ? ? Cardiovascular ?Exercise Tolerance: Good ?negative cardio ROS ? ? ?Rhythm:regular Rate:Normal ? ? ?  ?Neuro/Psych ? Headaches, negative psych ROS  ? GI/Hepatic ?Neg liver ROS, GERD  Medicated,  ?Endo/Other  ?negative endocrine ROS ? Renal/GU ?negative Renal ROS  ?negative genitourinary ?  ?Musculoskeletal ? ? Abdominal ?  ?Peds ? Hematology ?negative hematology ROS ?(+)   ?Anesthesia Other Findings ? ? Reproductive/Obstetrics ?negative OB ROS ? ?  ? ? ? ? ? ? ? ? ? ? ? ? ? ?  ?  ? ? ? ? ? ? ? ? ?Anesthesia Physical ?Anesthesia Plan ? ?ASA: 3 ? ?Anesthesia Plan: General  ? ?Post-op Pain Management:   ? ?Induction:  ? ?PONV Risk Score and Plan: Propofol infusion ? ?Airway Management Planned:  ? ?Additional Equipment:  ? ?Intra-op Plan:  ? ?Post-operative Plan:  ? ?Informed Consent: I have reviewed the patients History and Physical, chart, labs and discussed the procedure including the risks, benefits and alternatives for the proposed anesthesia with the patient or authorized representative who has indicated his/her understanding and acceptance.  ? ? ? ?Dental Advisory Given ? ?Plan Discussed with: CRNA ? ?Anesthesia Plan Comments:   ? ? ? ? ? ? ?Anesthesia Quick Evaluation ? ?

## 2021-07-01 NOTE — H&P (Signed)
Keith Shields is an 61 y.o. male.   ?Chief Complaint: screening colonoscopy ?HPI: 61 y/o M with PMH GERD, coming for screening colonoscopy. Last colonoscopy was 12 years ago, which was normal The patient denies having any complaints such as melena, hematochezia, abdominal pain or distention, change in her bowel movement consistency or frequency, no changes in her weight recently.  No family history of colorectal cancer. ? ?Past Medical History:  ?Diagnosis Date  ? GERD (gastroesophageal reflux disease)   ? History of kidney stones   ? Laryngopharyngeal reflux   ? Migraines   ? ? ?Past Surgical History:  ?Procedure Laterality Date  ? CYSTOSCOPY WITH RETROGRADE PYELOGRAM, URETEROSCOPY AND STENT PLACEMENT Right 03/05/2021  ? Procedure: CYSTOSCOPY WITH RIGHT RETROGRADE PYELOGRAM, URETEROSCOPY AND STENT PLACEMENT;  Surgeon: Franchot Gallo, MD;  Location: [facility];  Service: Urology;  Laterality: Right;  ? FRACTURE SURGERY Left   ? plates and screws  ? HERNIA REPAIR    ? Three times  ? KNEE SURGERY Right   ? LEG SURGERY Left   ? ? ?Family History  ?Problem Relation Age of Onset  ? Dementia Mother   ? Leukemia Father   ? Cancer Sister   ?     Colon  ? Diabetes Brother   ? ?Social History:  reports that he has never smoked. He has never used smokeless tobacco. He reports current alcohol use. He reports that he does not use drugs. ? ?Allergies:  ?Allergies  ?Allergen Reactions  ? Codeine Nausea Only  ? Keflex [Cephalexin] Rash  ? ? ?Medications Prior to Admission  ?Medication Sig Dispense Refill  ? omeprazole (PRILOSEC) 40 MG capsule Take 40 mg by mouth 2 (two) times daily.    ? polyethylene glycol-electrolytes (TRILYTE) 420 g solution Take 4,000 mLs by mouth as directed. 4000 mL 0  ? sildenafil (REVATIO) 20 MG tablet Take 1-5 tablets (20-100 mg total) by mouth as needed. 50 tablet 1  ? Tetrahydrozoline HCl (VISINE OP) Place 1 drop into both eyes daily as needed (irritation).    ? ? ?No results found  for this or any previous visit (from the past 48 hour(s)). ?No results found. ? ?Review of Systems  ?Constitutional: Negative.   ?HENT: Negative.    ?Eyes: Negative.   ?Respiratory: Negative.    ?Cardiovascular: Negative.   ?Gastrointestinal: Negative.   ?Endocrine: Negative.   ?Genitourinary: Negative.   ?Musculoskeletal: Negative.   ?Skin: Negative.   ?Allergic/Immunologic: Negative.   ?Neurological: Negative.   ?Hematological: Negative.   ?Psychiatric/Behavioral: Negative.    ? ?Blood pressure (!) 145/81, pulse 67, temperature 98.1 ?F (36.7 ?C), temperature source Oral, resp. rate 17, height '5\' 7"'$  (1.702 m), weight 77.1 kg, SpO2 97 %. ?Physical Exam  ?GENERAL: The patient is AO x3, in no acute distress. ?HEENT: Head is normocephalic and atraumatic. EOMI are intact. Mouth is well hydrated and without lesions. ?NECK: Supple. No masses ?LUNGS: Clear to auscultation. No presence of rhonchi/wheezing/rales. Adequate chest expansion ?HEART: RRR, normal s1 and s2. ?ABDOMEN: Soft, nontender, no guarding, no peritoneal signs, and nondistended. BS +. No masses. ?EXTREMITIES: Without any cyanosis, clubbing, rash, lesions or edema. ?NEUROLOGIC: AOx3, no focal motor deficit. ?SKIN: no jaundice, no rashes ? ?Assessment/Plan ?61 y/o M with PMH GERD, coming for screening colonoscopy.The patient is at average risk for colorectal cancer.  We will proceed with colonoscopy today. ? ? ?Harvel Quale, MD ?07/01/2021, 12:18 PM ? ? ? ?

## 2021-07-01 NOTE — Progress Notes (Signed)
Keith Shields had a procedure on 07/01/2021 and was unable to work.  He may return to work Friday, 07/03/2021, without restrictions.  ?

## 2021-07-01 NOTE — Transfer of Care (Signed)
Immediate Anesthesia Transfer of Care Note ? ?Patient: Keith Shields ? ?Procedure(s) Performed: COLONOSCOPY WITH PROPOFOL ?POLYPECTOMY ? ?Patient Location: PACU ? ?Anesthesia Type:General ? ?Level of Consciousness: awake, alert  and oriented ? ?Airway & Oxygen Therapy: Patient Spontanous Breathing ? ?Post-op Assessment: Report given to RN, Post -op Vital signs reviewed and stable, Patient moving all extremities X 4 and Patient able to stick tongue midline ? ?Post vital signs: Reviewed ? ?Last Vitals:  ?Vitals Value Taken Time  ?BP 87/64   ?Temp 36.5 ?C 07/01/21 1248  ?Pulse 88   ?Resp 17 07/01/21 1248  ?SpO2 98 % 07/01/21 1248  ? ? ?Last Pain:  ?Vitals:  ? 07/01/21 1248  ?TempSrc: Oral  ?PainSc: 0-No pain  ?   ? ?Patients Stated Pain Goal: 5 (07/01/21 1147) ? ?Complications: No notable events documented. ?

## 2021-07-02 LAB — SURGICAL PATHOLOGY

## 2021-07-02 NOTE — Anesthesia Postprocedure Evaluation (Signed)
Anesthesia Post Note ? ?Patient: Keith Shields ? ?Procedure(s) Performed: COLONOSCOPY WITH PROPOFOL ?POLYPECTOMY ? ?Patient location during evaluation: Phase II ?Anesthesia Type: General ?Level of consciousness: awake ?Pain management: pain level controlled ?Vital Signs Assessment: post-procedure vital signs reviewed and stable ?Respiratory status: spontaneous breathing and respiratory function stable ?Cardiovascular status: blood pressure returned to baseline and stable ?Postop Assessment: no headache and no apparent nausea or vomiting ?Anesthetic complications: no ?Comments: Late entry ? ? ?No notable events documented. ? ? ?Last Vitals:  ?Vitals:  ? 07/01/21 1147 07/01/21 1248  ?BP: (!) 145/81 107/71  ?Pulse: 67   ?Resp: 17 17  ?Temp: 36.7 ?C 36.5 ?C  ?SpO2: 97% 98%  ?  ?Last Pain:  ?Vitals:  ? 07/01/21 1248  ?TempSrc: Oral  ?PainSc: 0-No pain  ? ? ?  ?  ?  ?  ?  ?  ? ?Louann Sjogren ? ? ? ? ?

## 2021-07-08 ENCOUNTER — Encounter (HOSPITAL_COMMUNITY): Payer: Self-pay | Admitting: Gastroenterology

## 2021-10-07 DIAGNOSIS — K219 Gastro-esophageal reflux disease without esophagitis: Secondary | ICD-10-CM | POA: Diagnosis not present

## 2022-01-07 ENCOUNTER — Ambulatory Visit: Payer: BC Managed Care – PPO | Admitting: Family Medicine

## 2022-01-07 ENCOUNTER — Encounter: Payer: Self-pay | Admitting: Family Medicine

## 2022-01-07 VITALS — BP 116/92 | HR 100 | Temp 98.6°F | Ht 67.0 in | Wt 170.0 lb

## 2022-01-07 DIAGNOSIS — A084 Viral intestinal infection, unspecified: Secondary | ICD-10-CM | POA: Diagnosis not present

## 2022-01-07 MED ORDER — ONDANSETRON 4 MG PO TBDP: 4.0000 mg | ORAL_TABLET | Freq: Three times a day (TID) | ORAL | 0 refills | Status: AC | PRN

## 2022-01-07 NOTE — Progress Notes (Signed)
BP (!) 116/92   Pulse 100   Temp 98.6 F (37 C)   Ht '5\' 7"'$  (1.702 m)   Wt 170 lb (77.1 kg)   SpO2 98%   BMI 26.63 kg/m    Subjective:   Patient ID: Keith Shields, male    DOB: 01-30-1961, 61 y.o.   MRN: [id number]  HPI: Keith Shields is a 61 y.o. male presenting on 01/07/2022 for Emesis (Started last night - 4-5 today ) and Diarrhea (Started last night )   HPI Nausea vomiting Patient is coming in today with complaints of nausea and vomiting and diarrhea.  He says this started around midnight last night.  He denies any fevers or chills or blood in his vomit or diarrhea.  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  Its only him and his wife living together and she is not ill with it.  Relevant past medical, surgical, family and social history reviewed and updated as indicated. Interim medical history since our last visit reviewed. Allergies and medications reviewed and updated.  Review of Systems  Constitutional:  Negative for chills and fever.  Respiratory:  Negative for shortness of breath and wheezing.   Cardiovascular:  Negative for chest pain and leg swelling.  Gastrointestinal:  Positive for abdominal pain, diarrhea, nausea and vomiting. Negative for blood in stool and constipation.  Musculoskeletal:  Negative for back pain and gait problem.  Skin:  Negative for rash.  All other systems reviewed and are negative.   Per HPI unless specifically indicated above   Allergies as of 01/07/2022       Reactions   Codeine Nausea Only   Keflex [cephalexin] Rash        Medication List        Accurate as of January 07, 2022  3:58 PM. If you have any questions, ask your nurse or doctor.          omeprazole 40 MG capsule Commonly known as: PRILOSEC Take 40 mg by mouth 2 (two) times daily.   ondansetron 4 MG disintegrating tablet Commonly known as: ZOFRAN-ODT Take 1 tablet (4 mg  total) by mouth every 8 (eight) hours as needed for nausea or vomiting. Started by: Worthy Rancher, MD   sildenafil 20 MG tablet Commonly known as: REVATIO Take 1-5 tablets (20-100 mg total) by mouth as needed.   VISINE OP Place 1 drop into both eyes daily as needed (irritation).         Objective:   BP (!) 116/92   Pulse 100   Temp 98.6 F (37 C)   Ht '5\' 7"'$  (1.702 m)   Wt 170 lb (77.1 kg)   SpO2 98%   BMI 26.63 kg/m   Wt Readings from Last 3 Encounters:  01/07/22 170 lb (77.1 kg)  07/01/21 170 lb (77.1 kg)  04/08/21 170 lb (77.1 kg)    Physical Exam Vitals and nursing note reviewed.  Constitutional:      Appearance: Normal appearance.  Abdominal:     General: Abdomen is flat. Bowel sounds are normal. There is no distension.     Tenderness: There is abdominal tenderness in the epigastric area. There is no right CVA tenderness, left CVA tenderness, guarding or rebound.  Neurological:     Mental Status: He is alert.       Assessment & Plan:   Problem List Items  Addressed This Visit   None Visit Diagnoses     Viral gastroenteritis    -  Primary   Relevant Medications   ondansetron (ZOFRAN-ODT) 4 MG disintegrating tablet     Sent Zofran for the patient, also recommended that he could do Imodium after the nausea and vomiting have subsided.  Follow up plan: Return if symptoms worsen or fail to improve.  Counseling provided for all of the vaccine components No orders of the defined types were placed in this encounter.   Caryl Pina, MD [facility] 01/07/2022, 3:58 PM

## 2022-02-13 DIAGNOSIS — R0781 Pleurodynia: Secondary | ICD-10-CM | POA: Diagnosis not present

## 2022-02-13 DIAGNOSIS — S20212A Contusion of left front wall of thorax, initial encounter: Secondary | ICD-10-CM | POA: Diagnosis not present

## 2022-02-13 DIAGNOSIS — S20222A Contusion of left back wall of thorax, initial encounter: Secondary | ICD-10-CM | POA: Diagnosis not present

## 2022-02-13 DIAGNOSIS — S299XXA Unspecified injury of thorax, initial encounter: Secondary | ICD-10-CM | POA: Diagnosis not present

## 2022-02-19 DIAGNOSIS — I700 Atherosclerosis of aorta: Secondary | ICD-10-CM | POA: Diagnosis not present

## 2022-02-19 DIAGNOSIS — R079 Chest pain, unspecified: Secondary | ICD-10-CM | POA: Diagnosis not present

## 2022-02-19 DIAGNOSIS — N200 Calculus of kidney: Secondary | ICD-10-CM | POA: Diagnosis not present

## 2022-02-19 DIAGNOSIS — R1012 Left upper quadrant pain: Secondary | ICD-10-CM | POA: Diagnosis not present

## 2022-02-19 DIAGNOSIS — S2232XA Fracture of one rib, left side, initial encounter for closed fracture: Secondary | ICD-10-CM | POA: Diagnosis not present

## 2022-02-19 DIAGNOSIS — K5730 Diverticulosis of large intestine without perforation or abscess without bleeding: Secondary | ICD-10-CM | POA: Diagnosis not present

## 2022-02-19 DIAGNOSIS — Z885 Allergy status to narcotic agent status: Secondary | ICD-10-CM | POA: Diagnosis not present

## 2022-02-19 DIAGNOSIS — R109 Unspecified abdominal pain: Secondary | ICD-10-CM | POA: Diagnosis not present

## 2022-02-19 DIAGNOSIS — S301XXA Contusion of abdominal wall, initial encounter: Secondary | ICD-10-CM | POA: Diagnosis not present

## 2022-03-14 ENCOUNTER — Ambulatory Visit
Admission: EM | Admit: 2022-03-14 | Discharge: 2022-03-14 | Disposition: A | Discharge status: Home / Self Care | Payer: BC Managed Care – PPO | Attending: Family Medicine | Admitting: Family Medicine

## 2022-03-14 DIAGNOSIS — T1591XA Foreign body on external eye, part unspecified, right eye, initial encounter: Secondary | ICD-10-CM | POA: Diagnosis not present

## 2022-03-14 DIAGNOSIS — S0501XA Injury of conjunctiva and corneal abrasion without foreign body, right eye, initial encounter: Secondary | ICD-10-CM | POA: Diagnosis not present

## 2022-03-14 MED ORDER — ERYTHROMYCIN 5 MG/GM OP OINT: TOPICAL_OINTMENT | OPHTHALMIC | 0 refills | Status: DC

## 2022-03-14 NOTE — ED Provider Notes (Signed)
[facility]    CSN: [id number] Arrival date & time: 03/14/22  1354      History   Chief Complaint Chief Complaint  Patient presents with   Foreign Body in Eye    HPI Keith Shields is a 62 y.o. male.   Patient presenting today with right eye irritation that started about an hour ago when he was grinding metal and got a piece of metal in his eye directly over his pupil.  He tried to flush it out at home with no success.  Denies visual change, headache, dizziness, nausea, vomiting.    Past Medical History:  Diagnosis Date   GERD (gastroesophageal reflux disease)    History of kidney stones    Laryngopharyngeal reflux    Migraines     Patient Active Problem List   Diagnosis Date Noted   Subconjunctival hemorrhage of right eye 11/14/2020   Rib fractures 01/27/2018    Past Surgical History:  Procedure Laterality Date   COLONOSCOPY WITH PROPOFOL N/A 07/01/2021   Procedure: COLONOSCOPY WITH PROPOFOL;  Surgeon: Harvel Quale, MD;  Location: [facility];  Service: Gastroenterology;  Laterality: N/A;  [place], URETEROSCOPY AND STENT PLACEMENT Right 03/05/2021   Procedure: CYSTOSCOPY WITH RIGHT RETROGRADE PYELOGRAM, URETEROSCOPY AND STENT PLACEMENT;  Surgeon: Franchot Gallo, MD;  Location: [facility];  Service: Urology;  Laterality: Right;   FRACTURE SURGERY Left    plates and screws   HERNIA REPAIR     Three times   KNEE SURGERY Right    LEG SURGERY Left    POLYPECTOMY  07/01/2021   Procedure: POLYPECTOMY;  Surgeon: Harvel Quale, MD;  Location: [facility];  Service: Gastroenterology;;       Home Medications    Prior to Admission medications   Medication Sig Start Date End Date Taking? Authorizing Provider  erythromycin ophthalmic ointment Place a 1/2 inch ribbon of ointment into the right lower eyelid BID prn. 03/14/22  Yes Volney American, PA-C  omeprazole (PRILOSEC)  40 MG capsule Take 40 mg by mouth 2 (two) times daily.    [provider]  ondansetron (ZOFRAN-ODT) 4 MG disintegrating tablet Take 1 tablet (4 mg total) by mouth every 8 (eight) hours as needed for nausea or vomiting. 01/07/22   Dettinger, Fransisca Kaufmann, MD  sildenafil (REVATIO) 20 MG tablet Take 1-5 tablets (20-100 mg total) by mouth as needed. 04/08/21   Dettinger, Fransisca Kaufmann, MD  Tetrahydrozoline HCl (VISINE OP) Place 1 drop into both eyes daily as needed (irritation).    [provider]    Family History Family History  Problem Relation Age of Onset   Dementia Mother    Leukemia Father    Cancer Sister        Colon   Diabetes Brother     Social History Social History   Tobacco Use   Smoking status: Never   Smokeless tobacco: Never  Vaping Use   Vaping Use: Never used  Substance Use Topics   Alcohol use: Yes    Comment: some   Drug use: No     Allergies   Codeine and Keflex [cephalexin]   Review of Systems Review of Systems Per HPI  Physical Exam Triage Vital Signs ED Triage Vitals [03/14/22 1405]  Enc Vitals Group     BP (!) 158/97     Pulse Rate 98     Resp 16     Temp 98.5 F (36.9  C)     Temp Source Oral     SpO2 97 %     Weight      Height      Head Circumference      Peak Flow      Pain Score 5     Pain Loc      Pain Edu?      Excl. in [place]?    No data found.  Updated Vital Signs BP (!) 158/97 (BP Location: Right Arm)   Pulse 98   Temp 98.5 F (36.9 C) (Oral)   Resp 16   SpO2 97%   Visual Acuity Right Eye Distance:   Left Eye Distance:   Bilateral Distance:    Right Eye Near:   Left Eye Near:    Bilateral Near:     Physical Exam Vitals and nursing note reviewed.  Constitutional:      Appearance: Normal appearance.  HENT:     Head: Atraumatic.  Eyes:     Extraocular Movements: Extraocular movements intact.     Conjunctiva/sclera: Conjunctivae normal.     Comments: Tetracaine drops provided for anesthetic, cotton  swab used to carefully swipe at the tiny metal shard directly over the center of pupil.  This was tolerated well, abrasion noted to area on fluorescein stain post procedure.  Cardiovascular:     Rate and Rhythm: Normal rate and regular rhythm.  Pulmonary:     Effort: Pulmonary effort is normal.     Breath sounds: Normal breath sounds.  Musculoskeletal:        General: Normal range of motion.     Cervical back: Normal range of motion and neck supple.  Skin:    General: Skin is warm and dry.  Neurological:     General: No focal deficit present.     Mental Status: He is oriented to person, place, and time.  Psychiatric:        Mood and Affect: Mood normal.        Thought Content: Thought content normal.        Judgment: Judgment normal.      UC Treatments / Results  Labs (all labs ordered are listed, but only abnormal results are displayed) Labs Reviewed - No data to display  EKG   Radiology No results found.  Procedures Procedures (including critical care time)  Medications Ordered in UC Medications - No data to display  Initial Impression / Assessment and Plan / UC Course  I have reviewed the triage vital signs and the nursing notes.  Pertinent labs & imaging results that were available during my care of the patient were reviewed by me and considered in my medical decision making (see chart for details).     Treat with erythromycin ointment, hydrating drops and follow-up with ophthalmology if not fully resolving over the next few days.  Visual acuity declined as vision intact  Final Clinical Impressions(s) / UC Diagnoses   Final diagnoses:  Foreign body of right eye, initial encounter  Abrasion of right cornea, initial encounter     Discharge Instructions      Use the erythromycin ointment at least twice daily, if not improving over the next 3 to 5 days follow-up with the eye specialist    ED Prescriptions     Medication Sig Dispense Auth. Provider    erythromycin ophthalmic ointment Place a 1/2 inch ribbon of ointment into the right lower eyelid BID prn. 3.5 g Volney American, [place]  PDMP not reviewed this encounter.   Volney American, [place] 03/14/22 1557

## 2022-03-14 NOTE — Discharge Instructions (Signed)
Use the erythromycin ointment at least twice daily, if not improving over the next 3 to 5 days follow-up with the eye specialist

## 2022-03-14 NOTE — ED Triage Notes (Signed)
Pt states he was grinding and thinks a piece of metal went in his right eye.  States he tried to flush it out with no success.

## 2022-10-28 DIAGNOSIS — Z20822 Contact with and (suspected) exposure to covid-19: Secondary | ICD-10-CM | POA: Diagnosis not present

## 2022-10-28 DIAGNOSIS — S161XXA Strain of muscle, fascia and tendon at neck level, initial encounter: Secondary | ICD-10-CM | POA: Diagnosis not present

## 2022-10-28 DIAGNOSIS — M545 Low back pain, unspecified: Secondary | ICD-10-CM | POA: Diagnosis not present

## 2022-10-28 DIAGNOSIS — R000 Tachycardia, unspecified: Secondary | ICD-10-CM | POA: Diagnosis not present

## 2022-10-28 DIAGNOSIS — M542 Cervicalgia: Secondary | ICD-10-CM | POA: Diagnosis not present

## 2022-11-04 ENCOUNTER — Ambulatory Visit: Payer: BC Managed Care – PPO | Admitting: Nurse Practitioner

## 2022-11-04 ENCOUNTER — Encounter: Payer: Self-pay | Admitting: Nurse Practitioner

## 2022-11-04 VITALS — BP 131/86 | HR 63 | Temp 97.8°F | Resp 20 | Ht 67.0 in | Wt 172.0 lb

## 2022-11-04 DIAGNOSIS — M545 Low back pain, unspecified: Secondary | ICD-10-CM

## 2022-11-04 DIAGNOSIS — S139XXD Sprain of joints and ligaments of unspecified parts of neck, subsequent encounter: Secondary | ICD-10-CM

## 2022-11-04 MED ORDER — PREDNISONE 10 MG (21) PO TBPK: ORAL_TABLET | ORAL | 0 refills | Status: DC
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
## 2022-11-04 NOTE — Patient Instructions (Signed)
Acute Back Pain, Adult Acute back pain is sudden and usually short-lived. It is often caused by an injury to the muscles and tissues in the back. The injury may result from: A muscle, tendon, or ligament getting overstretched or torn. Ligaments are tissues that connect bones to each other. Lifting something improperly can cause a back strain. Wear and tear (degeneration) of the spinal disks. Spinal disks are circular tissue that provide cushioning between the bones of the spine (vertebrae). Twisting motions, such as while playing sports or doing yard work. A hit to the back. Arthritis. You may have a physical exam, lab tests, and imaging tests to find the cause of your pain. Acute back pain usually goes away with rest and home care. Follow these instructions at home: Managing pain, stiffness, and swelling Take over-the-counter and prescription medicines only as told by your health care provider. Treatment may include medicines for pain and inflammation that are taken by mouth or applied to the skin, or muscle relaxants. Your health care provider may recommend applying ice during the first 24-48 hours after your pain starts. To do this: Put ice in a plastic bag. Place a towel between your skin and the bag. Leave the ice on for 20 minutes, 2-3 times a day. Remove the ice if your skin turns bright red. This is very important. If you cannot feel pain, heat, or cold, you have a greater risk of damage to the area. If directed, apply heat to the affected area as often as told by your health care provider. Use the heat source that your health care provider recommends, such as a moist heat pack or a heating pad. Place a towel between your skin and the heat source. Leave the heat on for 20-30 minutes. Remove the heat if your skin turns bright red. This is especially important if you are unable to feel pain, heat, or cold. You have a greater risk of getting burned. Activity  Do not stay in bed. Staying in  bed for more than 1-2 days can delay your recovery. Sit up and stand up straight. Avoid leaning forward when you sit or hunching over when you stand. If you work at a desk, sit close to it so you do not need to lean over. Keep your chin tucked in. Keep your neck drawn back, and keep your elbows bent at a 90-degree angle (right angle). Sit high and close to the steering wheel when you drive. Add lower back (lumbar) support to your car seat, if needed. Take short walks on even surfaces as soon as you are able. Try to increase the length of time you walk each day. Do not sit, drive, or stand in one place for more than 30 minutes at a time. Sitting or standing for long periods of time can put stress on your back. Do not drive or use heavy machinery while taking prescription pain medicine. Use proper lifting techniques. When you bend and lift, use positions that put less stress on your back: Bend your knees. Keep the load close to your body. Avoid twisting. Exercise regularly as told by your health care provider. Exercising helps your back heal faster and helps prevent back injuries by keeping muscles strong and flexible. Work with a physical therapist to make a safe exercise program, as recommended by your health care provider. Do any exercises as told by your physical therapist. Lifestyle Maintain a healthy weight. Extra weight puts stress on your back and makes it difficult to have good   posture. Avoid activities or situations that make you feel anxious or stressed. Stress and anxiety increase muscle tension and can make back pain worse. Learn ways to manage anxiety and stress, such as through exercise. General instructions Sleep on a firm mattress in a comfortable position. Try lying on your side with your knees slightly bent. If you lie on your back, put a pillow under your knees. Keep your head and neck in a straight line with your spine (neutral position) when using electronic equipment like  smartphones or pads. To do this: Raise your smartphone or pad to look at it instead of bending your head or neck to look down. Put the smartphone or pad at the level of your face while looking at the screen. Follow your treatment plan as told by your health care provider. This may include: Cognitive or behavioral therapy. Acupuncture or massage therapy. Meditation or yoga. Contact a health care provider if: You have pain that is not relieved with rest or medicine. You have increasing pain going down into your legs or buttocks. Your pain does not improve after 2 weeks. You have pain at night. You lose weight without trying. You have a fever or chills. You develop nausea or vomiting. You develop abdominal pain. Get help right away if: You develop new bowel or bladder control problems. You have unusual weakness or numbness in your arms or legs. You feel faint. These symptoms may represent a serious problem that is an emergency. Do not wait to see if the symptoms will go away. Get medical help right away. Call your local emergency services (911 in the [place] Do not drive yourself to the hospital. Summary Acute back pain is sudden and usually short-lived. Use proper lifting techniques. When you bend and lift, use positions that put less stress on your back. Take over-the-counter and prescription medicines only as told by your health care provider, and apply heat or ice as told. This information is not intended to replace advice given to you by your health care provider. Make sure you discuss any questions you have with your health care provider. Document Revised: 05/09/2020 Document Reviewed: 05/09/2020 Elsevier Patient Education  2024 [employer]  

## 2022-12-23 DIAGNOSIS — K219 Gastro-esophageal reflux disease without esophagitis: Secondary | ICD-10-CM | POA: Diagnosis not present

## 2024-01-30 ENCOUNTER — Ambulatory Visit: Payer: Self-pay

## 2024-01-30 ENCOUNTER — Encounter: Payer: Self-pay | Admitting: Nurse Practitioner

## 2024-01-30 ENCOUNTER — Ambulatory Visit (INDEPENDENT_AMBULATORY_CARE_PROVIDER_SITE_OTHER): Admitting: Nurse Practitioner

## 2024-01-30 VITALS — BP 135/86 | HR 96 | Temp 97.6°F | Ht 67.0 in | Wt 170.0 lb

## 2024-01-30 DIAGNOSIS — J069 Acute upper respiratory infection, unspecified: Secondary | ICD-10-CM

## 2024-01-30 DIAGNOSIS — R509 Fever, unspecified: Secondary | ICD-10-CM | POA: Diagnosis not present

## 2024-01-30 LAB — VERITOR SARS-COV-2 AND FLU A+B
BD Veritor SARS-CoV-2 Ag: NEGATIVE
Influenza A: NEGATIVE
Influenza B: NEGATIVE

## 2024-01-30 MED ORDER — AZITHROMYCIN 250 MG PO TABS: ORAL_TABLET | ORAL | 0 refills | Status: AC

## 2024-01-30 MED ORDER — PREDNISONE 20 MG PO TABS: 40.0000 mg | ORAL_TABLET | Freq: Every day | ORAL | 0 refills | Status: AC

## 2024-01-30 MED ORDER — BENZONATATE 100 MG PO CAPS: 100.0000 mg | ORAL_CAPSULE | Freq: Two times a day (BID) | ORAL | 0 refills | Status: AC | PRN

## 2024-01-30 NOTE — Telephone Encounter (Signed)
 Per chart review, patient was seen in PCP office today and treated. No additional triage attempts needed.

## 2024-01-30 NOTE — Telephone Encounter (Signed)
 Copied from CRM #[id number]. Topic: Clinical - Red Word Triage >> Jan 30, 2024  8:14 AM Miquel SAILOR wrote: Red Word that prompted transfer to Nurse Triage: Sick congetsion/mucus yellow green/Sinus for 4 days/not sleeping/Getting worse

## 2024-01-30 NOTE — Progress Notes (Signed)
 Subjective:    Patient ID: Keith Shields, male    DOB: 04/10/1960, 63 y.o.   MRN: [id number]   Chief Complaint: Cough and Nasal Congestion   URI  This is a new problem. The current episode started in the past 7 days. The problem has been waxing and waning. The maximum temperature recorded prior to his arrival was 100.4 - 100.9 F. Associated symptoms include congestion, coughing, headaches and rhinorrhea. Pertinent negatives include no sinus pain or sore throat (raw throat). Treatments tried: muciex, zycam. The treatment provided mild relief.    Patient Active Problem List   Diagnosis Date Noted   Subconjunctival hemorrhage of right eye 11/14/2020   Rib fractures 01/27/2018       Review of Systems  Constitutional:  Positive for chills and fever (?).  HENT:  Positive for congestion and rhinorrhea. Negative for sinus pain and sore throat (raw throat).   Respiratory:  Positive for cough.   Neurological:  Positive for headaches.       Objective:   Physical Exam Constitutional:      Appearance: Normal appearance.  HENT:     Right Ear: Tympanic membrane normal.     Left Ear: Tympanic membrane normal.     Nose: Congestion and rhinorrhea present.     Mouth/Throat:     Pharynx: Posterior oropharyngeal erythema (mild) present. No oropharyngeal exudate.  Cardiovascular:     Rate and Rhythm: Normal rate and regular rhythm.     Heart sounds: Normal heart sounds.  Pulmonary:     Breath sounds: Normal breath sounds.  Musculoskeletal:     Cervical back: Normal range of motion and neck supple.  Skin:    General: Skin is warm.  Neurological:     General: No focal deficit present.     Mental Status: He is alert and oriented to person, place, and time.  Psychiatric:        Mood and Affect: Mood normal.    BP 135/86   Pulse 96   Temp 97.6 F (36.4 C) (Temporal)   Ht 5' 7 (1.702 m)   Wt 170 lb (77.1 kg)   SpO2 95%   BMI 26.63 kg/m    Flu- negative Covid negative      Assessment & Plan:   Keith Shields in today with chief complaint of Cough and Nasal Congestion   1. Fever, unspecified fever cause (Primary) - Veritor SARS-CoV-2 and Flu A+B  2. URI with cough and congestion 1. Take meds as prescribed 2. Use a cool mist humidifier especially during the winter months and when heat has been humid. 3. Use saline nose sprays frequently 4. Saline irrigations of the nose can be very helpful if done frequently.  * 4X daily for 1 week*  * Use of a nettie pot can be helpful with this. Follow directions with this* 5. Drink plenty of fluids 6. Keep thermostat turn down low 7.For any cough or congestion- tessalon perles 8. For fever or aces or pains- take tylenol  or ibuprofen  appropriate for age and weight.  * for fevers greater than 101 orally you may alternate ibuprofen  and tylenol  every  3 hours.    - azithromycin (ZITHROMAX Z-PAK) 250 MG tablet; As directed  Dispense: 6 tablet; Refill: 0 - predniSONE  (DELTASONE ) 20 MG tablet; Take 2 tablets (40 mg total) by mouth daily with breakfast for 5 days. 2 po daily for 5 days  Dispense: 10 tablet; Refill: 0 - benzonatate (TESSALON) 100 MG capsule; Take  1 capsule (100 mg total) by mouth 2 (two) times daily as needed for cough.  Dispense: 20 capsule; Refill: 0    The above assessment and management plan was discussed with the patient. The patient verbalized understanding of and has agreed to the management plan. Patient is aware to call the clinic if symptoms persist or worsen. Patient is aware when to return to the clinic for a follow-up visit. Patient educated on when it is appropriate to go to the emergency department.   Mary-Margaret Gladis, FNP

## 2024-01-30 NOTE — Patient Instructions (Signed)

## 2024-01-30 NOTE — Telephone Encounter (Signed)
 Pt was seen today.

## 2024-01-31 ENCOUNTER — Ambulatory Visit: Payer: Self-pay | Admitting: Nurse Practitioner
